# Patient Record
Sex: Female | Born: 1935 | Hispanic: No | Smoking: Never smoker
Health system: Southern US, Community
[De-identification: ages and names within clinical notes are randomized; demographics above are authoritative.]

## PROBLEM LIST (undated history)

## (undated) DIAGNOSIS — Z8542 Personal history of malignant neoplasm of other parts of uterus: Secondary | ICD-10-CM

## (undated) DIAGNOSIS — J309 Allergic rhinitis, unspecified: Secondary | ICD-10-CM

## (undated) DIAGNOSIS — K219 Gastro-esophageal reflux disease without esophagitis: Secondary | ICD-10-CM

## (undated) DIAGNOSIS — E119 Type 2 diabetes mellitus without complications: Secondary | ICD-10-CM

## (undated) DIAGNOSIS — J45909 Unspecified asthma, uncomplicated: Secondary | ICD-10-CM

## (undated) DIAGNOSIS — Z853 Personal history of malignant neoplasm of breast: Secondary | ICD-10-CM

## (undated) HISTORY — DX: Personal history of malignant neoplasm of breast: Z85.3

## (undated) HISTORY — DX: Personal history of malignant neoplasm of other parts of uterus: Z85.42

## (undated) HISTORY — DX: Type 2 diabetes mellitus without complications: E11.9

## (undated) HISTORY — PX: MASTECTOMY: SHX3

## (undated) HISTORY — PX: TOTAL ABDOMINAL HYSTERECTOMY W/ BILATERAL SALPINGOOPHORECTOMY: SHX83

## (undated) HISTORY — DX: Unspecified asthma, uncomplicated: J45.909

## (undated) HISTORY — DX: Allergic rhinitis, unspecified: J30.9

## (undated) HISTORY — DX: Gastro-esophageal reflux disease without esophagitis: K21.9

---

## 2004-06-14 ENCOUNTER — Ambulatory Visit: Payer: Self-pay | Admitting: Internal Medicine

## 2004-12-20 ENCOUNTER — Ambulatory Visit: Payer: Self-pay | Admitting: Internal Medicine

## 2005-06-14 ENCOUNTER — Ambulatory Visit: Payer: Self-pay | Admitting: Internal Medicine

## 2005-12-18 ENCOUNTER — Ambulatory Visit: Payer: Self-pay | Admitting: Internal Medicine

## 2006-06-18 ENCOUNTER — Ambulatory Visit: Payer: Self-pay | Admitting: Internal Medicine

## 2006-12-16 ENCOUNTER — Ambulatory Visit: Payer: Self-pay | Admitting: Internal Medicine

## 2007-02-04 ENCOUNTER — Ambulatory Visit: Payer: Self-pay | Admitting: Internal Medicine

## 2007-08-05 ENCOUNTER — Ambulatory Visit: Payer: Self-pay | Admitting: Internal Medicine

## 2007-08-05 DIAGNOSIS — J302 Other seasonal allergic rhinitis: Secondary | ICD-10-CM

## 2007-08-05 DIAGNOSIS — J4520 Mild intermittent asthma, uncomplicated: Secondary | ICD-10-CM | POA: Insufficient documentation

## 2007-08-05 DIAGNOSIS — J3089 Other allergic rhinitis: Secondary | ICD-10-CM

## 2007-08-05 LAB — CONVERTED CEMR LAB
ALT: 14 units/L (ref 0–35)
Alkaline Phosphatase: 40 units/L (ref 39–117)
Bilirubin, Direct: 0.1 mg/dL (ref 0.0–0.3)
CO2: 30 meq/L (ref 19–32)
Chloride: 108 meq/L (ref 96–112)
Glucose, Bld: 93 mg/dL (ref 70–99)
Hemoglobin: 12.9 g/dL (ref 12.0–15.0)
Lymphocytes Relative: 25.7 % (ref 12.0–46.0)
Monocytes Relative: 7.7 % (ref 3.0–12.0)
Neutrophils Relative %: 62.7 % (ref 43.0–77.0)
Platelets: 296 10*3/uL (ref 150–400)
Potassium: 4.5 meq/L (ref 3.5–5.1)
RDW: 13.6 % (ref 11.5–14.6)
Sodium: 142 meq/L (ref 135–145)
Total Bilirubin: 0.5 mg/dL (ref 0.3–1.2)
Total Protein: 7.1 g/dL (ref 6.0–8.3)

## 2007-08-17 DIAGNOSIS — K219 Gastro-esophageal reflux disease without esophagitis: Secondary | ICD-10-CM

## 2007-08-17 DIAGNOSIS — E119 Type 2 diabetes mellitus without complications: Secondary | ICD-10-CM | POA: Insufficient documentation

## 2007-08-18 ENCOUNTER — Telehealth (INDEPENDENT_AMBULATORY_CARE_PROVIDER_SITE_OTHER): Payer: Self-pay | Admitting: *Deleted

## 2008-02-17 ENCOUNTER — Ambulatory Visit: Payer: Self-pay | Admitting: Pulmonary Disease

## 2008-03-16 ENCOUNTER — Telehealth (INDEPENDENT_AMBULATORY_CARE_PROVIDER_SITE_OTHER): Payer: Self-pay | Admitting: *Deleted

## 2008-08-16 ENCOUNTER — Ambulatory Visit: Payer: Self-pay | Admitting: Internal Medicine

## 2009-03-16 ENCOUNTER — Ambulatory Visit: Payer: Self-pay | Admitting: Internal Medicine

## 2009-09-13 ENCOUNTER — Ambulatory Visit: Payer: Self-pay | Admitting: Internal Medicine

## 2010-03-20 ENCOUNTER — Ambulatory Visit
Admission: RE | Admit: 2010-03-20 | Discharge: 2010-03-20 | Payer: Self-pay | Source: Home / Self Care | Attending: Internal Medicine | Admitting: Internal Medicine

## 2010-04-13 NOTE — Assessment & Plan Note (Signed)
Summary: rov 6 months///kp   Primary Provider/Referring Provider:  Livia Snellen  CC:  6 month follow up. pt states breathing has unchanged and denies any cough. pt states overall things are going well. Marland Kitchen  History of Present Illness: March 16, 2009- Asthma, allergic rhinitis She caught cold at [employer]. Says having doxy available kept her out of ER. Still cough productive yellow. Denies fever, chest pain. Some wheeze. Only needed nebulizer once. Continues Advair. Needs a rescue inhaler.  September 13, 2009- Asthma, allergic rhinitis Very hot days "drain her". Arthritis pains are more limiting. Has recurrent rash on left flank she treats with neosporin- resolved. Has not needed benzonatate. Uses 1 puff of her rescue inhaler about 1-2x/ week if she feels dyspneic out doing errands. Again she mostly blames the heat. Denies chest pain, palpitation or wheezing. She stays in when grass being mowed.   March 20, 2010- Asthma, allergic rhinitis Nurse-CC: 6 month follow up. pt states breathing has unchanged and denies any cough. pt states overall things are going well.  Since i last saw her in July she remembers one cold when she did wheeze some, but cleared up.  Was having some nsasl stuffiness last week while working outdoors.    Asthma History    Asthma Control Assessment:    Age range: 12+ years    Symptoms: 0-2 days/week    Nighttime Awakenings: 0-2/month    Interferes w/ normal activity: no limitations    SABA use (not for EIB): 0-2 days/week    Asthma Control Assessment: Well Controlled   Preventive Screening-Counseling & Management  Alcohol-Tobacco     Smoking Status: never  Allergies: No Known Drug Allergies  Past History:  Past Medical History: Last updated: 03/16/2009 ALLERGIC RHINITIS (ICD-477.9) ASTHMA (ICD-493.90) Diabetes, Type 2 G E R Hx breast ca- right mastectomy Hx uterine cancer  Past Surgical History: Last updated: 03/16/2009 Right mastectomy [facility]  and B S O  Family History: Last updated: 08/05/2007  Allergies- daughter, grandchildren Asthma-grandson, daughter Heart disease-mother rheumatism-great grandmother cancer-self brother- diabetes sister-diabetes, high cholesterol 6 additional sisters living  Social History: Last updated: 08/05/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   Risk Factors: Smoking Status: never (03/20/2010)  Review of Systems      See HPI  The patient denies shortness of breath with activity, shortness of breath at rest, productive cough, non-productive cough, coughing up blood, chest pain, irregular heartbeats, acid heartburn, indigestion, loss of appetite, weight change, abdominal pain, difficulty swallowing, sore throat, tooth/dental problems, headaches, nasal congestion/difficulty breathing through nose, and sneezing.    Vital Signs:  Patient profile:   77 year old female Height:      60 inches Weight:      203.38 pounds O2 Sat:      96 % on Room air Pulse rate:   90 / minute BP sitting:   150 / 68  (right arm) Cuff size:   large  Vitals Entered By: Carver Fila (March 20, 2010 4:22 PM)  O2 Flow:  Room air CC: 6 month follow up. pt states breathing has unchanged and denies any cough. pt states overall things are going well.  Comments meds and allergies updated Phone number updated Carver Fila  March 20, 2010 4:22 PM    Physical Exam  Additional Exam:  General: A/Ox3; pleasant and cooperative, NAD, overweight SKIN: no rash, lesions NODES: no lymphadenopathy HEENT: [place]/AT, EOM- WNL, Conjuctivae- clear,  PERRLA, TM-WNL, Nose- sniffing watery congested, Throat- clear and wnl, Mallampati  IV NECK: Supple w/ fair ROM, JVD- trace, normal carotid impulses w/o bruits Thyroid-  CHEST: Faint expiratory wheeze right upper back, fair airflow, unlabored HEART: RRR, no m/g/r heard ABDOMEN: Soft and nl;  EAV:WUJW, nl pulses, no edema  NEURO:  Grossly intact to observation      Impression & Recommendations:  Problem # 1:  ASTHMA (ICD-493.90) Well controlled with appropriate use of her medicines. She asks to continue 6 month follow-up.   Problem # 2:  ALLERGIC RHINITIS (ICD-477.9) Variable stuffiness is overall, not bad enough to require intervention.  Medications Added to Medication List This Visit: 1)  Icaps Mv Tabs (Multiple vitamins-minerals) .... Once daily 2)  Refresh Eye Drops  .Marland Kitchen.. 1 drop each eye two times a day  Other Orders: Est. Patient Level III (11914)  Patient Instructions: 1)  Please schedule a follow-up appointment in 6 months. 2)  Continue present meds. Please call sooner as needed.    Immunization History:  Influenza Immunization History:    Influenza:  historical (11/10/2009)

## 2010-04-13 NOTE — Assessment & Plan Note (Signed)
Summary: 6 MONTHS/APC   Primary Provider/Referring Provider:  Livia Snellen  CC:  6 month follow up visit-asthma and allergies. .  History of Present Illness: 02/17/08-Asthma, allergic rhinitis ER for a flare of asthma, blames overheating in mid summer but ok since then. Uses diuretic as needed to control edema. Tolerated colonoscopy without respiratory problems. had flu vax, declines H1n1- low risk.  08/16/08- Asthma, allergic rhinitis.........................daughter here with [place]. Flutter sensation under left breast intermittently. She blames stress of sister with Alzheimers. Denies exertional chest pain, radiation, heartburn, nausea, vominting, discolored sputum.. Feels tired. Bothered by hot weather. Alprazolam  helps this taken two times a day.  March 16, 2009- Asthma, allergic rhinitis She caught cold at [facility]. Says having doxy available kept her out of ER. Still cough productive yellow. Denies fever, chest pain. Some wheeze. Only needed nebulizer once. Continues Advair. Needs a rescue inhaler.  September 13, 2009- Asthma, allergic rhinitis Very hot days "drain her". Arthritis pains are more limiting. Has recurrent rash on left flank she treats with neosporin- resolved. Has not needed benzonatate. Uses 1 puff of her rescue inhaler about 1-2x/ week if she feels dyspneic out doing errands. Again she mostly blames the heat. Denies chest pain, palpitation or wheezing. She stays in when grass being mowed.    Asthma History    Initial Asthma Severity Rating:    Age range: 12+ years    Symptoms: 0-2 days/week    Nighttime Awakenings: 0-2/month    Interferes w/ normal activity: no limitations    SABA use (not for EIB): 0-2 days/week    Asthma Severity Assessment: Intermittent   Preventive Screening-Counseling & Management  Alcohol-Tobacco     Smoking Status: never  Current Medications (verified): 1)  Advair Diskus 250-50 Mcg/dose  Misc (Fluticasone-Salmeterol) .Marland Kitchen.. 1 Puff Two  Times A Day Rinse Well 2)  Benzonatate 100 Mg  Caps (Benzonatate) .Marland Kitchen.. 1 Four Times A Day As Needed For Cough 3)  Accolate 20 Mg  Tabs (Zafirlukast) .... Take 1 Two Times A Day 4)  Exforge 10-320 Mg  Tabs (Amlodipine Besylate-Valsartan) .... Take 1 By Mouth Once Daily 5)  Metformin Hcl 500 Mg  Tabs (Metformin Hcl) .... Take 1/2 By Mouth  Once Daily 6)  Lipitor 10 Mg  Tabs (Atorvastatin Calcium) .... Take 1 By Mouth Once Daily 7)  Furosemide 20 Mg  Tabs (Furosemide) .... Take 1 By Mouth Once Daily As Needed 8)  Tamoxifen Citrate 20 Mg  Tabs (Tamoxifen Citrate) .... Take 1 By Mouth Once Daily 9)  Alprazolam 0.25 Mg  Tabs (Alprazolam) .... Take 1 By Mouth Two Times A Day 10)  Boniva 150 Mg  Tabs (Ibandronate Sodium) .... Take 1 By Mouth Every Month 11)  Klor-Con M20 20 Meq  Tbcr (Potassium Chloride Crys Cr) .... Take 1 By Mouth Two Times A Day 12)  Restasis 0.05 %  Emul (Cyclosporine) .Marland Kitchen.. 1 Drop in Eye Two Times A Day 13)  Centrum Silver   Tabs (Multiple Vitamins-Minerals) .... Take 1 By Mouth Once Daily 14)  Albuterol Sulfate (2.5 Mg/24ml) 0.083% Nebu (Albuterol Sulfate) .Marland Kitchen.. 1 Four Times A Day As Needed 15)  Proair Hfa 108 (90 Base) Mcg/act Aers (Albuterol Sulfate) .... 2 Puffs Four Times A Day As Needed Rescue 16)  Doxycycline Hyclate 100 Mg Tabs (Doxycycline Hyclate) .... 2 Today Then One Daily 17)  Benzonatate 100 Mg Caps (Benzonatate) .Marland Kitchen.. 1 Four Times A Day As Needed Cough  Allergies (verified): No Known Drug Allergies  Past  History:  Past Medical History: Last updated: 03/16/2009 ALLERGIC RHINITIS (ICD-477.9) ASTHMA (ICD-493.90) Diabetes, Type 2 G E R Hx breast ca- right mastectomy Hx uterine cancer  Past Surgical History: Last updated: 03/16/2009 Right mastectomy T A [facility] and B S O  Family History: Last updated: 08/05/2007  Allergies- daughter, grandchildren Asthma-grandson, daughter Heart disease-mother rheumatism-great grandmother cancer-self brother-  diabetes sister-diabetes, high cholesterol 6 additional sisters living  Social History: Last updated: 08/05/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   Risk Factors: Smoking Status: never (09/13/2009)  Review of Systems      See HPI  Vital Signs:  Patient profile:   77 year old female Height:      60 inches Weight:      201 pounds BMI:     39.40 O2 Sat:      95 % on Room air Pulse rate:   85 / minute BP sitting:   132 / 82  (left arm) Cuff size:   regular  Vitals Entered By: Reynaldo Minium CMA (September 13, 2009 9:47 AM)  O2 Flow:  Room air CC: 6 month follow up visit-asthma and allergies.    Physical Exam  Additional Exam:  General: A/Ox3; pleasant and cooperative, NAD, overweight SKIN: no rash, lesions NODES: no lymphadenopathy HEENT: /AT, EOM- WNL, Conjuctivae- clear, PERRLA, TM-WNL, Nose- sniffing watery congested, Throat- clear and wnl NECK: Supple w/ fair ROM, JVD- none, normal carotid impulses w/o bruits Thyroid-  CHEST: Faint expiratory wheeze right upper back, fair airflow, unlabored HEART: RRR, no m/g/r heard ABDOMEN: Soft and nl;  UEA:VWUJ, nl pulses, no edema  NEURO: Grossly intact to observation      Impression & Recommendations:  Problem # 1:  ASTHMA (ICD-493.90) Good control. She doesn't distinguish dyspnea related to heat, humiditiy and air quality, from asthma with wheeze. She is not needing rescue inhaler much, and carries dust mask. I think she is doing pretty well.  Problem # 2:  ALLERGIC RHINITIS (ICD-477.9)  Seasonal stuffiness is now minimal.   Problem # 3:  G E R D (ICD-530.81) Feels controlled,without sleep disturbance by heart burn or cough.  Other Orders: Est. Patient Level III (81191)  Patient Instructions: 1)  Please schedule a follow-up appointment in 6 months. 2)  Call sooner as needed.

## 2010-04-13 NOTE — Assessment & Plan Note (Signed)
Summary: 6 months/apc   Primary Provider/Referring Provider:  Livia Snellen  CC:  6 mo follow up.  states breathing is doing well overall.  having nasal congestion, cough with yellow mucus, and and chest fullness x1wk.  c/o coughing up a small dark red clot yeterday.  had rx for doxy on hold-will finish it tomorrow.  requesting rx for benzonatate.Marland Kitchen  History of Present Illness: .08/05/07- [name] [name] returns for follow-up of her asthma and allergic rhinitis.  She's had increased cough over the past 3 months.  Her primary physician.  Given antibiotic a prednisone taper and cough medicine, which she finished last week.  She feels much better, but somewhat tired.  She is coughing less.  There's been nothing purulent or bloody and no chest pain or palpitation. her daughter was with her today and participated in the discussion.  02/17/08-Asthma, allergic rhinitis ER for a flare of asthma, blames overheating in mid summer but ok since then. Uses diuretic as needed to control edema. Tolerated colonoscopy without respiratory problems. had flu vax, declines H1n1- low risk.  08/16/08- Asthma, allergic rhinitis.........................daughter here with [place]. Flutter sensation under left breast intermittently. She blames stress of sister with Alzheimers. Denies exertional chest pain, radiation, heartburn, nausea, vominting, discolored sputum.. Feels tired. Bothered by hot weather. Alprazolam  helps this taken two times a day.  March 16, 2009- Asthma, allergic rhinitis She caught cold at [facility]. Says having doxy available kept her out of ER. Still cough productive yellow. Denies fever, chest pain. Some wheeze. Only needed nebulizer once. Continues Advair. Needs a rescue inhaler.   Current Medications (verified): 1)  Advair Diskus 250-50 Mcg/dose  Misc (Fluticasone-Salmeterol) .Marland Kitchen.. 1 Puff Two Times A Day Rinse Well 2)  Benzonatate 100 Mg  Caps (Benzonatate) .Marland Kitchen.. 1 Four Times A Day As Needed For Cough 3)   Accolate 20 Mg  Tabs (Zafirlukast) .... Take 1 Two Times A Day 4)  Exforge 10-320 Mg  Tabs (Amlodipine Besylate-Valsartan) .... Take 1 By Mouth Once Daily 5)  Metformin Hcl 500 Mg  Tabs (Metformin Hcl) .... Take 1/2 By Mouth  Once Daily 6)  Lipitor 10 Mg  Tabs (Atorvastatin Calcium) .... Take 1 By Mouth Once Daily 7)  Furosemide 20 Mg  Tabs (Furosemide) .... Take 1 By Mouth Once Daily As Needed 8)  Tamoxifen Citrate 20 Mg  Tabs (Tamoxifen Citrate) .... Take 1 By Mouth Once Daily 9)  Alprazolam 0.25 Mg  Tabs (Alprazolam) .... Take 1 By Mouth Two Times A Day 10)  Boniva 150 Mg  Tabs (Ibandronate Sodium) .... Take 1 By Mouth Every Month 11)  Klor-Con M20 20 Meq  Tbcr (Potassium Chloride Crys Cr) .... Take 1 By Mouth Two Times A Day 12)  Restasis 0.05 %  Emul (Cyclosporine) .Marland Kitchen.. 1 Drop in Eye Two Times A Day 13)  Centrum Silver   Tabs (Multiple Vitamins-Minerals) .... Take 1 By Mouth Once Daily 14)  Albuterol Sulfate (2.5 Mg/23ml) 0.083% Nebu (Albuterol Sulfate) .Marland Kitchen.. 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   Risk Factors: Smoking Status: never (08/05/2007)  Past Medical History: ALLERGIC RHINITIS (ICD-477.9) ASTHMA (ICD-493.90) Diabetes, Type 2 G E R Hx breast ca- right  mastectomy Hx uterine cancer  Past Surgical History: Right mastectomy T A H and B S O  Review of Systems      See HPI       The patient complains of dyspnea on exertion and prolonged cough.  The patient denies anorexia, fever, weight loss, weight gain, vision loss, decreased hearing,  hoarseness, chest pain, syncope, peripheral edema, headaches, hemoptysis, abdominal pain, and severe indigestion/heartburn.    Vital Signs:  Patient profile:   77 year old female Height:      60 inches Weight:      205.50 pounds BMI:     40.28 O2 Sat:      94 % on Room air Pulse rate:   85 / minute BP sitting:   130 / 72  (left arm) Cuff size:   regular  Vitals Entered By: Gweneth Dimitri RN (March 16, 2009 9:59 AM)  O2 Flow:  Room air CC: 6 mo follow up.  states breathing is doing well overall.  having nasal congestion, cough with yellow mucus, and chest fullness x1wk.  c/o coughing up a small dark red clot yeterday.  had rx for doxy on hold-will finish it tomorrow.  requesting rx for benzonatate. Comments Medications reviewed with patient Gweneth Dimitri RN  March 16, 2009 9:59 AM    Physical Exam  Additional Exam:  General: A/Ox3; pleasant and cooperative, NAD, overweight SKIN: no rash, lesions NODES: no lymphadenopathy HEENT: [place]/AT, EOM- WNL, Conjuctivae- clear, PERRLA, TM-WNL, Nose- sniffing watery congested, Throat- clear and wnl NECK: Supple w/ fair ROM, JVD- none, normal carotid impulses w/o bruits Thyroid-  CHEST: Clear to P&A, fair airflow, unlabored HEART: RRR, no m/g/r heard ABDOMEN: Soft and nl;  YQI:HKVQ, nl pulses, no edema  NEURO: Grossly intact to observation      Impression & Recommendations:  Problem # 1:  ASTHMA (ICD-493.90) Generally good control, but needs a rescue inhaler. Now an incidental viral syndrome. I will let her keep doxy on hand.  Medications Added to Medication List This Visit: 1)  Furosemide 20 Mg Tabs (Furosemide) .... Take 1 by mouth once daily as needed 2)  Proair Hfa 108 (90 Base) Mcg/act Aers (Albuterol sulfate) .... 2 puffs four times a day as needed rescue 3)  Doxycycline Hyclate 100 Mg Tabs (Doxycycline hyclate) .... 2 today then one daily 4)  Benzonatate 100 Mg Caps (Benzonatate) .Marland Kitchen.. 1 four times a day as needed  cough  Other Orders: Est. Patient Level III (25956)  Patient Instructions: 1)  Please schedule a follow-up appointment in 6 months. 2)  Scripts to refill doxycycline and benzonatate 3)  Script sample and instruction Proair rescue inhaler, 2 puffs up to 4 times daily as needed when you aren't near your nebulizer machine  Prescriptions: BENZONATATE 100 MG  CAPS (BENZONATATE) 1 four times a day as needed for cough  #20 x prn   Entered and Authorized by:   Waymon Budge MD   Signed by:   Waymon Budge MD on 03/16/2009   Method used:   Print then Give to Patient   RxID:   [id number] BENZONATATE 100 MG CAPS (BENZONATATE) 1 four times a day as needed cough  #30 x prn   Entered and Authorized by:   Waymon Budge MD   Signed by:   Waymon Budge MD on 03/16/2009   Method used:   Print then Give to Patient   RxID:   [id number] DOXYCYCLINE HYCLATE 100 MG TABS (DOXYCYCLINE HYCLATE)  2 today then one daily  #8 x 3   Entered and Authorized by:   Waymon Budge MD   Signed by:   Waymon Budge MD on 03/16/2009   Method used:   Print then Give to Patient   RxID:   [id number] PROAIR HFA 108 (90 BASE) MCG/ACT AERS (ALBUTEROL SULFATE) 2 puffs four times a day as needed rescue  #1 x prn   Entered and Authorized by:   Waymon Budge MD   Signed by:   Waymon Budge MD on 03/16/2009   Method used:   Print then Give to Patient   RxID:   [id number]     Immunization History:  Influenza Immunization History:    Influenza:  historical (11/29/2008)  Pneumovax Immunization History:    Pneumovax:  historical (12/11/2006)

## 2010-07-25 NOTE — Assessment & Plan Note (Signed)
 HEALTHCARE                             PULMONARY OFFICE NOTE   NAME:[name], [name]                         MRN:          [id number]  DATE:02/04/2007                            DOB:          04-24-1925    PROBLEMS:  1. Allergic rhinitis.  2. Asthma.  3. Esophageal reflux.  4. Diabetes.  5. Breast cancer/right mastectomy.   HISTORY:  Six month follow up. Arthritis bothers her and limits her  walking, so she does not notice exertional dyspnea much, but she has not  had any acute attacks recently and feels stable. She is comfortable with  present therapy. She has had flu vaccine and has had at least 2 doses of  pneumococcal vaccine.   MEDICATIONS:  Her medication list is reviewed. She continues:  1. Albuterol by nebulizer q.i.d. p.r.n.  2. Advair 250/50 b.i.d.  3. Accolate 20 mg b.i.d.   She has not needed a rescue inhaler, although we discussed it again.   OBJECTIVE:  Weight 196 pounds, blood pressure 126/80, pulse 75, room air  saturation 97%. She seems very clear with no rales, rhonchi, or  dullness. There is no neck vein distension or strider. No cyanosis,  clubbing, or peripheral edema. Heart sounds are regular without murmur.  Nasal airway is not congested.   IMPRESSION:  Stable, well controlled asthma and rhinitis.   PLAN:  No changes, although watch need for a rescue inhaler. Fair to  offer to see in her in a year, and she asked to be seen again in six  months, so we will do that.     Clinton D. Maple Hudson, MD, Tonny Bollman, FACP  Electronically Signed    CDY/MedQ  DD: 02/08/2007  DT: 02/09/2007  Job #: [id number]   cc:   Livia Snellen

## 2010-07-28 NOTE — Assessment & Plan Note (Signed)
 HEALTHCARE                               PULMONARY OFFICE NOTE   NAME:[name] [name], [name] [name]                         MRN:          [id number]  DATE:12/18/2005                            DOB:          07/18/1925    PROBLEMS:  1. Allergic rhinitis.  2. Asthma.  3. Esophageal reflux.  4. Diabetes.  5. Breast cancer/right mastectomy.   HISTORY:  Since last year, unfortunately, breast cancer was diagnosed and  she underwent a right mastectomy without adjuvant therapy.  She had no  respiratory problems during anesthesia.  She and her family member comment  that her asthma control has been very much better in recent years without  the frequent emergency room trips characteristic previously.  She feels well  today.   MEDICATION:  1. Metformin 1/2 b.i.d.  2. Boniva once monthly.  3. Advair 250/50.  4. Accolate 20 mg b.i.d.  5. Diovan HCT 80 mg.  6. Lipitor 10 mg.  7. Restasis.  8. Vitamins.  9. Calcium.  10.Acyclovir.  11.Tamoxifen 20 mg.  12.Albuterol by nebulizer q.i.d. p.r.n.   No medication allergy.   She never needs to use her nebulizer and has not needed a rescue inhaler.   OBJECTIVE:  VITAL SIGNS:  Weight 196 pounds.  BPP 122/84.  Pulse regular 69.  Room air saturation 98%.  HEENT:  Nose and throat are clear.  LUNGS:  Lung fields are clear with no wheeze, rales, or rhonchi and  breathing is unlabored.  HEART:  Heart sounds are regular without murmur, rub, or gallop.  EXTREMITIES:  There is no edema.   IMPRESSION:  Stable well-controlled asthma and seasonal rhinitis.   PLAN:  No need to change medications.  She requests and is given a Z-Pak to  hold with discussion.  Schedule return in 6 months, early p.r.n.       Clinton D. Maple Hudson, MD, FCCP, FACP      CDY/MedQ  DD:  12/23/2005  DT:  12/24/2005  Job #:  [id number]

## 2010-07-28 NOTE — Assessment & Plan Note (Signed)
[facility]                             PULMONARY OFFICE NOTE   NAME:[name], [name]                         MRN:          [id number]  DATE:06/18/2006                            DOB:          09/06/1925    PROBLEM:  1. Allergic rhinitis.  2. Asthma.  3. Esophageal reflux.  4. Diabetes.  5. Breast cancer/right mastectomy.   HISTORY:  She had a cold and used the standby Z-Pak.  She thinks that  this helped her get better much faster.  Recently, she has had mild head  and chest congestion blamed on pollen.   MEDICATIONS:  Her list is little changed on review.  She is using:  1. Advair 250/50.  2. Accolate 20 mg b.i.d.  3. She has an albuterol nebulizer for q.i.d. p.r.n. use.   ALLERGIES:  No medication allergy.   OBJECTIVE:  Weight 191 pounds.  Blood pressure 114/58, pulse regular at  62, room air saturation 96%.  She is alert and cheerful.  Eyes, nose and throat look clear.  There is no neck vein distention or stridor.  LUNGS:  Clear without wheeze or cough.  HEART:  Sounds regular without murmur.  No edema.   IMPRESSION:  1. Stable asthma.  2. Perhaps minimal seasonal rhinitis.   PLAN:  No changes.  We refilled her Advair 250/50 and gave her a  prescription for a Z-Pak to hold at her request with discussion.  Scheduled return in 6 months, earlier p.r.n.     Clinton D. Maple Hudson, MD, Tonny Bollman, FACP  Electronically Signed    CDY/MedQ  DD: 06/18/2006  DT: 06/19/2006  Job #: [id number]

## 2010-09-18 ENCOUNTER — Ambulatory Visit (INDEPENDENT_AMBULATORY_CARE_PROVIDER_SITE_OTHER): Payer: Medicare Other | Admitting: Internal Medicine

## 2010-09-18 ENCOUNTER — Encounter: Payer: Self-pay | Admitting: Internal Medicine

## 2010-09-18 VITALS — BP 126/78 | HR 72 | Ht 60.0 in | Wt 194.6 lb

## 2010-09-18 DIAGNOSIS — J309 Allergic rhinitis, unspecified: Secondary | ICD-10-CM

## 2010-09-18 DIAGNOSIS — K219 Gastro-esophageal reflux disease without esophagitis: Secondary | ICD-10-CM

## 2010-09-18 DIAGNOSIS — J45909 Unspecified asthma, uncomplicated: Secondary | ICD-10-CM

## 2010-09-18 NOTE — Assessment & Plan Note (Signed)
Reminded of reflux precautions and potential for reflux to aggravate asthma if ignored. She seems to be doing well.

## 2010-09-18 NOTE — Assessment & Plan Note (Signed)
Good control on current meds. We discussed options with no changes appropriate.

## 2010-09-18 NOTE — Patient Instructions (Addendum)
Continue present meds  Please call as needed 

## 2010-09-18 NOTE — Progress Notes (Signed)
  Subjective:    Patient ID: [name] [name], female    DOB: 11-11-1925, 77 y.o.   MRN: [id number]  HPI 09/18/10- 17 yoF never smoker followed for asthma, allergic rhinitis, complicated by DM, GERD, hx right breast Ca Last here January 9. 2012- Note reviewed.  Since last here she feels asthma has been well controlled, with no major flares. Occasional use of rescue inhaler, but no need for nebulizer. Continues daily Advair.  Denies heart problems. Continues tamoxifen, now nearly 5 years out from breast cancer right- lumpectomy no XRT.  Marland KitchenReview of Systems .Constitutional:   No weight loss, night sweats,  Fevers, chills, fatigue, lassitude. HEENT:   No headaches,  Difficulty swallowing,  Tooth/dental problems,  Sore throat,                No sneezing, itching, ear ache, nasal congestion, post nasal drip,   CV:  No chest pain,  Orthopnea, PND, swelling in lower extremities, anasarca, dizziness, palpitations  GI  No heartburn, indigestion, abdominal pain, nausea, vomiting, diarrhea, change in bowel habits, loss of appetite  Resp: No shortness of breath with exertion or at rest.  No excess mucus, no productive cough,  No non-productive cough,  No coughing up of blood.  No change in color of mucus.  No wheezing.    Skin: no rash or lesions.  GU: no dysuria, change in color of urine, no urgency or frequency.  No flank pain.  MS:  No joint pain or swelling.  No decreased range of motion.  No back pain.  Psych:  No change in mood or affect. No depression or anxiety.  No memory loss.      Objective:   Physical Exam General- Alert, Oriented, Affect-appropriate, Distress- none acute   Calm and conversational  Skin- rash-none, lesions- none, excoriation- none  Lymphadenopathy- none  Head- atraumatic  Eyes- Gross vision intact, PERRLA, conjunctivae clear secretions  Ears- Hearing, canals, Tm-normal  Nose- Clear, no- Septal dev, mucus, polyps, erosion, perforation   Throat- Mallampati III-IV  , mucosa clear , drainage- none, tonsils- atrophic  Neck- flexible , trachea midline, no stridor , thyroid nl, carotid no bruit  Chest - symmetrical excursion , unlabored     Heart/CV- RRR , no murmur , no gallop  , no rub, nl s1 s2                     - JVD- none , edema- none, stasis changes- none, varices- none     Lung- clear to P&A, wheeze- none, cough- none , dullness-none, rub- none     Chest wall-   Abd- tender-no, distended-no, bowel sounds-present, HSM- no  Br/ Gen/ Rectal- Not done, not indicated  Extrem- cyanosis- none, clubbing, none, atrophy- none, strength- nl  Neuro- grossly intact to observation         Assessment & Plan:

## 2010-09-18 NOTE — Assessment & Plan Note (Signed)
Good control

## 2010-11-02 ENCOUNTER — Other Ambulatory Visit: Payer: Self-pay | Admitting: Internal Medicine

## 2011-01-22 ENCOUNTER — Telehealth: Payer: Self-pay | Admitting: Internal Medicine

## 2011-01-22 NOTE — Telephone Encounter (Signed)
LMTCB x 1 

## 2011-01-23 MED ORDER — AZITHROMYCIN 250 MG PO TABS: ORAL_TABLET | ORAL | Status: DC

## 2011-01-23 NOTE — Telephone Encounter (Signed)
atc line busy x 3 wcb 

## 2011-01-23 NOTE — Telephone Encounter (Signed)
Spoke with pt's daughter. She states that pt forgot to ask for rx for abx to hold on to at her last ov, and wants to know if can have something now. She states that she is not currently sick, but Dr Maple Hudson usually will call in abx for her to hold in case she gets sick in the winter. Please advise, thanks! No Known Allergies

## 2011-01-23 NOTE — Telephone Encounter (Signed)
OK to offer Z pak, refill x 1. She can hold this until needed.

## 2011-01-23 NOTE — Telephone Encounter (Signed)
Rx for zpack was sent and spoke with pt's daughter and notified that this was done.

## 2011-01-23 NOTE — Telephone Encounter (Signed)
LMTCB

## 2011-03-14 ENCOUNTER — Encounter: Payer: Self-pay | Admitting: Internal Medicine

## 2011-03-14 ENCOUNTER — Ambulatory Visit (INDEPENDENT_AMBULATORY_CARE_PROVIDER_SITE_OTHER): Payer: Medicare Other | Admitting: Internal Medicine

## 2011-03-14 ENCOUNTER — Ambulatory Visit (INDEPENDENT_AMBULATORY_CARE_PROVIDER_SITE_OTHER)
Admission: RE | Admit: 2011-03-14 | Discharge: 2011-03-14 | Disposition: A | Discharge status: Home / Self Care | Payer: Medicare Other | Source: Ambulatory Visit | Attending: Internal Medicine | Admitting: Internal Medicine

## 2011-03-14 DIAGNOSIS — J45909 Unspecified asthma, uncomplicated: Secondary | ICD-10-CM

## 2011-03-14 DIAGNOSIS — R0989 Other specified symptoms and signs involving the circulatory and respiratory systems: Secondary | ICD-10-CM

## 2011-03-14 DIAGNOSIS — R0600 Dyspnea, unspecified: Secondary | ICD-10-CM

## 2011-03-14 DIAGNOSIS — J309 Allergic rhinitis, unspecified: Secondary | ICD-10-CM

## 2011-03-14 DIAGNOSIS — R0609 Other forms of dyspnea: Secondary | ICD-10-CM

## 2011-03-14 MED ORDER — AZITHROMYCIN 250 MG PO TABS: ORAL_TABLET | ORAL | Status: AC

## 2011-03-14 MED ORDER — FLUTICASONE-SALMETEROL 250-50 MCG/DOSE IN AEPB: 1.0000 | INHALATION_SPRAY | Freq: Two times a day (BID) | RESPIRATORY_TRACT | Status: DC

## 2011-03-14 MED ORDER — ALBUTEROL SULFATE (2.5 MG/3ML) 0.083% IN NEBU: 2.5000 mg | INHALATION_SOLUTION | Freq: Four times a day (QID) | RESPIRATORY_TRACT | Status: DC | PRN

## 2011-03-14 NOTE — Progress Notes (Signed)
Subjective:    Patient ID: [name] [name], female    DOB: 07-Aug-1925, 78 y.o.   MRN: [id number]  HPI 09/18/10- 84 yoF never smoker followed for asthma, allergic rhinitis, complicated by DM, GERD, hx right breast Ca Last here January 9. 2012- Note reviewed.  Since last here she feels asthma has been well controlled, with no major flares. Occasional use of rescue inhaler, but no need for nebulizer. Continues daily Advair.  Denies heart problems. Continues tamoxifen, now nearly 5 years out from breast cancer right- lumpectomy no XRT.  03/14/11- 84 yoF never smoker followed for asthma, allergic rhinitis, complicated by DM, GERD, hx right breast Ca Has had flu vaccine. In November she had a mild exacerbation of asthma associated with a viral chest cold. She took a Z-Pak and got better. She would like to be able to  hold a Z-Pak, which we discussed. She needs routine medication refills but otherwise feels that she is at baseline stable and comfortable currently.   .Review of Systems Constitutional:   No-   weight loss, night sweats, fevers, chills, fatigue, lassitude. HEENT:   No-  headaches, difficulty swallowing, tooth/dental problems, sore throat,       No-  sneezing, itching, ear ache, nasal congestion, post nasal drip,  CV:  No-   chest pain, orthopnea, PND, swelling in lower extremities, anasarca,  dizziness, palpitations Resp: No-   shortness of breath with exertion or at rest.              No-   productive cough,  No non-productive cough,  No- coughing up of blood.              No-   change in color of mucus.  No- wheezing.   Skin: No-   rash or lesions. GI:  No-   heartburn, indigestion, abdominal pain, nausea, vomiting, diarrhea,                 change in bowel habits, loss of appetite GU: MS:  No-   joint pain or swelling.  No- decreased range of motion.  No- back pain. Neuro-     nothing unusual Psych:  No- change in mood or affect. No depression or anxiety.  No memory loss.        Objective:  General- Alert, Oriented, Affect-appropriate, Distress- none acute, overweight Skin- rash-none, lesions- none, excoriation- none Lymphadenopathy- none Head- atraumatic            Eyes- Gross vision intact, PERRLA, conjunctivae clear secretions            Ears- Hearing, canals-normal            Nose- Clear, no-Septal dev, mucus, polyps, erosion, perforation             Throat- Mallampati IV , mucosa clear , drainage- none, tonsils- atrophic Neck- flexible , trachea midline, no stridor , thyroid nl, carotid no bruit Chest - symmetrical excursion , unlabored           Heart/CV- RRR , no murmur , no gallop  , no rub, nl s1 s2                           - JVD- 1-2 cm , edema- none, stasis changes- none, varices- none           Lung- clear to P&A, wheeze- none, cough- none , dullness-none, rub- none. Decreased sounds in bases.  Chest wall-  Abd- tender-no, distended-no, bowel sounds-present, HSM- no Br/ Gen/ Rectal- Not done, not indicated Extrem- cyanosis- none, clubbing, none, atrophy- none, strength- nl Neuro- grossly intact to observation

## 2011-03-14 NOTE — Patient Instructions (Addendum)
Refill scripts were sent to drug store. You will hold on to the azithromycin/ Zpak antibiotic in case needed.  Please call as needed  Order- CXR- dyspnea, asthma

## 2011-03-14 NOTE — Assessment & Plan Note (Signed)
Controlled, with no saeason- associated problems currently.

## 2011-03-14 NOTE — Assessment & Plan Note (Signed)
She had a viral- bronchitis earlier this fall and returned to her baseline. We discussed her medications and will refill. Breath sounds are reduced in the bases and she is a little too heavy for reliable percussion. Her neck veins look a little full but otherwise I don't find signs of fluid overload. Plan-chest x-ray. Continue routine meds.

## 2011-03-20 ENCOUNTER — Other Ambulatory Visit: Payer: Self-pay | Admitting: Internal Medicine

## 2011-03-26 NOTE — Progress Notes (Signed)
Quick Note:  ATC-unable to reach patient or leave a message. Will try again later ______

## 2011-04-05 NOTE — Progress Notes (Signed)
Quick Note:  ATC-unable to reach patient or leave a message. Will try again later; phone continues to say that patient is unavailable.  ______

## 2011-04-17 NOTE — Progress Notes (Signed)
Quick Note:  Pt aware of results. ______ 

## 2011-08-15 ENCOUNTER — Ambulatory Visit (HOSPITAL_COMMUNITY): Admission: RE | Admit: 2011-08-15 | Payer: Medicare Other | Source: Ambulatory Visit | Admitting: Ophthalmology

## 2011-08-15 SURGERY — MINOR CAPSULOTOMY
Anesthesia: LOCAL | Laterality: Left

## 2011-08-17 ENCOUNTER — Encounter (HOSPITAL_COMMUNITY): Payer: Self-pay

## 2011-08-23 ENCOUNTER — Encounter (HOSPITAL_COMMUNITY): Payer: Self-pay | Admitting: Pharmacy Technician

## 2011-09-05 ENCOUNTER — Encounter (HOSPITAL_COMMUNITY)
Admission: RE | Disposition: A | Discharge status: Home / Self Care | Payer: Self-pay | Source: Ambulatory Visit | Attending: Ophthalmology

## 2011-09-05 ENCOUNTER — Ambulatory Visit (HOSPITAL_COMMUNITY)
Admission: RE | Admit: 2011-09-05 | Discharge: 2011-09-05 | Disposition: A | Discharge status: Home / Self Care | Payer: Medicare Other | Source: Ambulatory Visit | Attending: Ophthalmology | Admitting: Ophthalmology

## 2011-09-05 ENCOUNTER — Other Ambulatory Visit: Payer: Self-pay | Admitting: Ophthalmology

## 2011-09-05 ENCOUNTER — Encounter: Payer: Self-pay | Admitting: Ophthalmology

## 2011-09-05 DIAGNOSIS — H269 Unspecified cataract: Secondary | ICD-10-CM | POA: Insufficient documentation

## 2011-09-05 HISTORY — PX: CAPSULOTOMY: SHX5412

## 2011-09-05 SURGERY — MINOR CAPSULOTOMY
Anesthesia: Topical | Laterality: Left

## 2011-09-05 SURGERY — MINOR CAPSULOTOMY
Anesthesia: LOCAL | Site: Eye | Laterality: Left

## 2011-09-05 MED ORDER — CYCLOPENTOLATE-PHENYLEPHRINE 0.2-1 % OP SOLN: 1.0000 [drp] | Freq: Once | OPHTHALMIC | Status: DC

## 2011-09-05 MED ORDER — APRACLONIDINE HCL 0.5 % OP SOLN
OPHTHALMIC | Status: AC
  Filled 2011-09-05: qty 5

## 2011-09-05 MED ORDER — APRACLONIDINE HCL 1 % OP SOLN: 1.0000 [drp] | Freq: Once | OPHTHALMIC | Status: DC

## 2011-09-05 MED ORDER — CYCLOPENTOLATE-PHENYLEPHRINE 0.2-1 % OP SOLN
OPHTHALMIC | Status: AC
  Filled 2011-09-05: qty 2

## 2011-09-05 MED ORDER — CYCLOPENTOLATE-PHENYLEPHRINE 0.2-1 % OP SOLN: 2.0000 [drp] | OPHTHALMIC | Status: DC

## 2011-09-05 MED ORDER — CYCLOPENTOLATE-PHENYLEPHRINE 0.2-1 % OP SOLN
1.0000 [drp] | Freq: Once | OPHTHALMIC | Status: AC
  Administered 2011-09-05: 1 [drp] via OPHTHALMIC

## 2011-09-05 MED ORDER — APRACLONIDINE HCL 1 % OP SOLN
1.0000 [drp] | Freq: Once | OPHTHALMIC | Status: AC
  Administered 2011-09-05: 1 [drp] via OPHTHALMIC
  Filled 2011-09-05: qty 0.1

## 2011-09-05 SURGICAL SUPPLY — 28 items
APPLICATOR COTTON TIP 6IN STRL (MISCELLANEOUS) ×2 IMPLANT
BAG FLD CLT MN 6.25X3.5 (WOUND CARE)
BAG MINI COLL DRAIN (WOUND CARE) IMPLANT
BLADE KERATOME 2.75 (BLADE) ×2 IMPLANT
BLADE MINI RND TIP GREEN BEAV (BLADE) IMPLANT
CLOTH BEACON ORANGE TIMEOUT ST (SAFETY) ×2 IMPLANT
CORDS BIPOLAR (ELECTRODE) ×2 IMPLANT
DRAPE OPHTHALMIC 40X48 W POUCH (DRAPES) ×2 IMPLANT
DRAPE RETRACTOR (MISCELLANEOUS) ×2 IMPLANT
GLOVE ECLIPSE 7.0 STRL STRAW (GLOVE) ×2 IMPLANT
GOWN STRL NON-REIN LRG LVL3 (GOWN DISPOSABLE) ×4 IMPLANT
KIT BASIN OR (CUSTOM PROCEDURE TRAY) ×2 IMPLANT
KIT ROOM TURNOVER OR (KITS) ×2 IMPLANT
KNIFE CRESCENT 2.5 55 ANG (BLADE) ×2 IMPLANT
MARKER SKIN DUAL TIP RULER LAB (MISCELLANEOUS) IMPLANT
NS IRRIG 1000ML POUR BTL (IV SOLUTION) ×2 IMPLANT
PACK CATARACT CUSTOM (CUSTOM PROCEDURE TRAY) ×2 IMPLANT
PACK CATARACT MCHSCP (PACKS) IMPLANT
PAD ARMBOARD 7.5X6 YLW CONV (MISCELLANEOUS) ×4 IMPLANT
PROBE ANTERIOR 20G W/INFUS NDL (MISCELLANEOUS) IMPLANT
SPEAR EYE SURG WECK-CEL (MISCELLANEOUS) IMPLANT
SUT ETHILON 10 0 CS140 6 (SUTURE) IMPLANT
SUT VICRYL 8 0 TG140 8 (SUTURE) IMPLANT
SYR 3ML LL SCALE MARK (SYRINGE) IMPLANT
TIP SILICONE STR (MISCELLANEOUS)
TIP SILICONE STR 0.3MM UFLOW (MISCELLANEOUS) IMPLANT
TOWEL OR 17X24 6PK STRL BLUE (TOWEL DISPOSABLE) ×4 IMPLANT
WATER STERILE IRR 1000ML POUR (IV SOLUTION) ×2 IMPLANT

## 2011-09-05 NOTE — H&P (Signed)
  78 yo female has had cataract surgery os.  Has developed cloudy opaque posterior capsule.  Admitted for yag laser capsulotomy

## 2011-09-05 NOTE — H&P (Signed)
  78 yo female has had cataract surgery os.  Now has cloudy posterior capsule which causes her  Vision to be blurred.  Admitted for yag laser  Capsulotomy left eye to improve her vision.

## 2011-09-05 NOTE — Brief Op Note (Signed)
09/05/2011  6:56 AM  PATIENT:  [name] [name]  78 y.o. female  PRE-OPERATIVE DIAGNOSIS:  opaque posterior capsule  POST-OPERATIVE DIAGNOSIS: Same  PROCEDURE:  Procedure(s) (LRB): MINOR CAPSULOTOMY (Left)  Total Energy     42 Total Bursts      10 Shots/Bursts     1 Energy/Shot       4.2    SURGEON:  Surgeon(s) and Role:    Vita Erm., MD - Primary  PHYSICIAN ASSISTANT:   ASSISTANTS: none   ANESTHESIA:   none  EBL:     BLOOD ADMINISTERED:none  DRAINS: none   LOCAL MEDICATIONS USED:  NONE  SPECIMEN:  No Specimen  DISPOSITION OF SPECIMEN:  N/A  COUNTS:  YES  TOURNIQUET:  * No tourniquets in log *  DICTATION: .Note written in EPIC  PLAN OF CARE: Discharge to home after PACU  PATIENT DISPOSITION:  Short Stay   Delay start of Pharmacological VTE agent (>24hrs) due to surgical blood loss or risk of bleeding: not applicable

## 2011-09-05 NOTE — Progress Notes (Signed)
SECOND DROP IOPODINE GIVEN TO LEFT EYE PER DR BREWINGTON'S ORDER.  PATIENT TOLERATED PROCEDURE WELL.

## 2011-09-05 NOTE — Discharge Instructions (Signed)
DR Mitzi Davenport SPOKE WITH PATIENT AND FAMILY, INSTRUCTED WHEN TO COME TO OFFICE.

## 2011-09-06 ENCOUNTER — Encounter (HOSPITAL_COMMUNITY): Payer: Self-pay

## 2011-09-06 ENCOUNTER — Encounter (HOSPITAL_COMMUNITY): Payer: Self-pay | Admitting: Ophthalmology

## 2011-09-11 ENCOUNTER — Ambulatory Visit (INDEPENDENT_AMBULATORY_CARE_PROVIDER_SITE_OTHER): Payer: Medicare Other | Admitting: Internal Medicine

## 2011-09-11 ENCOUNTER — Encounter: Payer: Self-pay | Admitting: Internal Medicine

## 2011-09-11 VITALS — BP 120/68 | HR 69 | Ht 60.0 in | Wt 196.8 lb

## 2011-09-11 DIAGNOSIS — J45998 Other asthma: Secondary | ICD-10-CM

## 2011-09-11 DIAGNOSIS — J45909 Unspecified asthma, uncomplicated: Secondary | ICD-10-CM

## 2011-09-11 MED ORDER — ALBUTEROL SULFATE HFA 108 (90 BASE) MCG/ACT IN AERS: 2.0000 | INHALATION_SPRAY | Freq: Four times a day (QID) | RESPIRATORY_TRACT | Status: DC | PRN

## 2011-09-11 NOTE — Patient Instructions (Addendum)
I am glad you are feeling better.   Continue present treatment. Please call as needed.

## 2011-09-11 NOTE — Progress Notes (Signed)
Subjective:    Patient ID: [name] [name], female    DOB: October 08, 1925, 78 y.o.   MRN: [id number]  HPI 09/18/10- 47 yoF never smoker followed for asthma, allergic rhinitis, complicated by DM, GERD, hx right breast Ca Last here January 9. 2012- Note reviewed.  Since last here she feels asthma has been well controlled, with no major flares. Occasional use of rescue inhaler, but no need for nebulizer. Continues daily Advair.  Denies heart problems. Continues tamoxifen, now nearly 5 years out from breast cancer right- lumpectomy no XRT.  03/14/11- 84 yoF never smoker followed for asthma, allergic rhinitis, complicated by DM, GERD, hx right breast Ca Has had flu vaccine. In November she had a mild exacerbation of asthma associated with a viral chest cold. She took a Z-Pak and got better. She would like to be able to  hold a Z-Pak, which we discussed. She needs routine medication refills but otherwise feels that she is at baseline stable and comfortable currently.  09/11/11-  84 yoF never smoker followed for asthma, allergic rhinitis, complicated by DM, GERD, hx right breast Ca Doing much better since getting over PNA back in May.    Daughter is here Treated for an outpatient pneumonia without chest x-ray-given Z-Pak by her primary physician. She now feels back to normal baseline. They have a dehumidifier in the house as this has been a very wet spring. She has had pneumonia vaccine at least twice. CXR 04/17/11-reviewed images with them. We looked at older images to demonstrate that the right hemidiaphragm is chronically elevated. IMPRESSION:  Marked elevation of the right hemidiaphragm again noted. No acute  infiltrate or pulmonary edema. Stable linear atelectasis or  scarring in the right middle lobe.  Original Report Authenticated By: [name], [name]   .Review of Systems- see HPI Constitutional:   No-   weight loss, night sweats, fevers, chills, fatigue, lassitude. HEENT:   No-  headaches, difficulty  swallowing, tooth/dental problems, sore throat,       No-  sneezing, itching, ear ache, nasal congestion, post nasal drip,  CV:  No-   chest pain, orthopnea, PND, swelling in lower extremities, anasarca,  dizziness, palpitations Resp: No-   shortness of breath with exertion or at rest.              No-   productive cough,  No non-productive cough,  No- coughing up of blood.              No-   change in color of mucus.  No- wheezing.   Skin: No-   rash or lesions. GI:  No-   heartburn, indigestion, abdominal pain, nausea, vomiting,  GU: MS:  No-   joint pain or swelling.   Neuro-     nothing unusual Psych:  No- change in mood or affect. No depression or anxiety.  No memory loss.  Objective:  General- Alert, Oriented, Affect-appropriate, Distress- none acute, overweight BP 120/68  Pulse 69  Ht 5' (1.524 m)  Wt 196 lb 12.8 oz (89.268 kg)  BMI 38.43 kg/m2  SpO2 96% Skin- rash-none, lesions- none, excoriation- none Lymphadenopathy- none Head- atraumatic            Eyes- Gross vision intact, PERRLA, conjunctivae clear secretions            Ears- Hearing, canals-normal            Nose- Clear, no-Septal dev, mucus, polyps, erosion, perforation  Throat- Mallampati IV , mucosa clear , drainage- none, tonsils- atrophic Neck- flexible , trachea midline, no stridor , thyroid nl, carotid no bruit Chest - symmetrical excursion , unlabored           Heart/CV- RRR , no murmur , no gallop  , no rub, nl s1 s2                           - JVD- 1-2 cm , edema- none, stasis changes- none, varices- none           Lung- clear to P&A, decreased at right base but limited bilaterally by her body habitus, wheeze- none, cough- none , rub- none.           Chest wall-  Abd-  Br/ Gen/ Rectal- Not done, not indicated Extrem- cyanosis- none, clubbing, none, atrophy- none, strength- nl Neuro- grossly intact to observation

## 2011-09-16 NOTE — Assessment & Plan Note (Signed)
Viruses and weather changes seem to be her most important triggers now. Generally she is well controlled. By her description, she resolved an exacerbation of bronchitis or unsubstantiated pneumonia with help of a Z-Pak from her primary physician. Her chronic right diaphragm paresis is is probably not as limiting as her obesity and lack of regular exercise.

## 2011-10-01 NOTE — Op Note (Signed)
[name] [name], [name] [name]                ACCOUNT NO.:  [id number]  MEDICAL RECORD NO.:  [id number]  LOCATION:  [facility]                         FACILITY:  [facility]  PHYSICIAN:  Salley Scarlet., M.D.DATE OF BIRTH:  12/11/1925  DATE OF PROCEDURE:  09/05/2011 DATE OF DISCHARGE:  09/05/2011                              OPERATIVE REPORT   PREOPERATIVE DIAGNOSIS:  Opaque posterior capsule, left eye.  POSTOPERATIVE DIAGNOSIS:  Opaque posterior capsule, left eye.  OPERATION:  YAG laser capsulotomy, left eye.  PROCEDURE:  The patient was brought to the laser room and positioned appropriately, hands and legs.  This was done after the pupil had been adequately dilated.  Approximately 10 bursts of laser energy were applied to the posterior capsule obtaining an opening in the posterior capsule that was quite satisfactory.  There were 10 bursts of laser energy applied to the posterior capsule to obtain this opening.  Each burst was approximately 4.2 millijoule per burst for a total energy of 42 millijoule.  The patient tolerated the procedure well and was discharged in satisfactory condition with instructions to see me tomorrow morning for further evaluation.  DISCHARGE DIAGNOSIS:  Opaque posterior capsule, left eye.     Salley Scarlet., [name]     TB/MEDQ  D:  10/01/2011  T:  10/01/2011  Job:  [id number]

## 2012-01-08 ENCOUNTER — Other Ambulatory Visit: Payer: Self-pay | Admitting: Internal Medicine

## 2012-03-14 ENCOUNTER — Encounter: Payer: Self-pay | Admitting: Internal Medicine

## 2012-03-14 ENCOUNTER — Ambulatory Visit (INDEPENDENT_AMBULATORY_CARE_PROVIDER_SITE_OTHER)
Admission: RE | Admit: 2012-03-14 | Discharge: 2012-03-14 | Disposition: A | Discharge status: Home / Self Care | Payer: Medicare Other | Source: Ambulatory Visit | Attending: Internal Medicine | Admitting: Internal Medicine

## 2012-03-14 ENCOUNTER — Ambulatory Visit (INDEPENDENT_AMBULATORY_CARE_PROVIDER_SITE_OTHER): Payer: Medicare Other | Admitting: Internal Medicine

## 2012-03-14 VITALS — BP 142/70 | HR 80 | Ht 60.0 in | Wt 200.0 lb

## 2012-03-14 DIAGNOSIS — J45998 Other asthma: Secondary | ICD-10-CM

## 2012-03-14 DIAGNOSIS — J45909 Unspecified asthma, uncomplicated: Secondary | ICD-10-CM

## 2012-03-14 NOTE — Patient Instructions (Signed)
Order- CXR   Dx asthma, hx pneumonia  Please call as needed

## 2012-03-14 NOTE — Progress Notes (Signed)
Subjective:    Patient ID: [name] [name], female    DOB: 1925/04/28, 79 y.o.   MRN: [id number]  HPI 09/18/10- 34 yoF never smoker followed for asthma, allergic rhinitis, complicated by DM, GERD, hx right breast Ca Last here January 9. 2012- Note reviewed.  Since last here she feels asthma has been well controlled, with no major flares. Occasional use of rescue inhaler, but no need for nebulizer. Continues daily Advair.  Denies heart problems. Continues tamoxifen, now nearly 5 years out from breast cancer right- lumpectomy no XRT.  03/14/11- 84 yoF never smoker followed for asthma, allergic rhinitis, complicated by DM, GERD, hx right breast Ca Has had flu vaccine. In November she had a mild exacerbation of asthma associated with a viral chest cold. She took a Z-Pak and got better. She would like to be able to  hold a Z-Pak, which we discussed. She needs routine medication refills but otherwise feels that she is at baseline stable and comfortable currently.  09/11/11-  79 yoF never smoker followed for asthma, allergic rhinitis, complicated by DM, GERD, hx right breast Ca Doing much better since getting over PNA back in May.    Daughter is here Treated for an outpatient pneumonia without chest x-ray-given Z-Pak by her primary physician. She now feels back to normal baseline. They have a dehumidifier in the house as this has been a very wet spring. She has had pneumonia vaccine at least twice. CXR 04/17/11-reviewed images with them. We looked at older images to demonstrate that the right hemidiaphragm is chronically elevated. IMPRESSION:  Marked elevation of the right hemidiaphragm again noted. No acute  infiltrate or pulmonary edema. Stable linear atelectasis or  scarring in the right middle lobe.  Original Report Authenticated By: [name], [name]   1/3//14- 79 yoF never smoker followed for asthma, allergic rhinitis, complicated by DM, GERD, hx right breast Ca             daughter here FOLLOWS FOR: PNA  in October; cold weather causes her to have SOB/wheezing. Outpatient pneumonia was diagnosed without chest x-ray in her home town and treated with prednisone and antibiotic. She is now back to baseline. She is satisfied with how that was managed I pointed out that she gets good care at home, but she still feels reassured by coming to see me twice a year. CXR 03/14/11 IMPRESSION:  Marked elevation of the right hemidiaphragm again noted. No acute  infiltrate or pulmonary edema. Stable linear atelectasis or  scarring in the right middle lobe.  Original Report Authenticated By: [name], [name]   .Review of Systems- see HPI Constitutional:   No-   weight loss, night sweats, fevers, chills, fatigue, lassitude. HEENT:   No-  headaches, difficulty swallowing, tooth/dental problems, sore throat,       No-  sneezing, itching, ear ache, nasal congestion, post nasal drip,  CV:  No-   chest pain, orthopnea, PND, swelling in lower extremities, anasarca,  dizziness, palpitations Resp: No-   shortness of breath with exertion or at rest.              No-   productive cough,  No non-productive cough,  No- coughing up of blood.              No-   change in color of mucus.  No- wheezing.   Skin: No-   rash or lesions. GI:  No-   heartburn, indigestion, abdominal pain, nausea, vomiting,  GU: MS:  No-  joint pain or swelling.   Neuro-     nothing unusual Psych:  No- change in mood or affect. No depression or anxiety.  No memory loss.  Objective:  General- Alert, Oriented, Affect-appropriate, Distress- none acute, overweight BP 142/70  Pulse 80  Ht 5' (1.524 m)  Wt 200 lb (90.719 kg)  BMI 39.06 kg/m2  SpO2 96% Skin- rash-none, lesions- none, excoriation- none Lymphadenopathy- none Head- atraumatic            Eyes- Gross vision intact, PERRLA, conjunctivae clear secretions            Ears- Hearing, canals-normal            Nose- Clear, no-Septal dev, mucus, polyps, erosion, perforation              Throat- Mallampati IV , mucosa clear , drainage- none, tonsils- atrophic Neck- flexible , trachea midline, no stridor , thyroid nl, carotid no bruit Chest - symmetrical excursion , unlabored           Heart/CV- RRR , no murmur , no gallop  , no rub, nl s1 s2                           - JVD- 1-2 cm , edema- none, stasis changes- none, varices- none           Lung- clear to P&A, + few crackles at right base, limited bilaterally by her body habitus, wheeze- none, cough- none , rub- none.           Chest wall-  Abd-  Br/ Gen/ Rectal- Not done, not indicated Extrem- cyanosis- none, clubbing, none, atrophy- none, strength- nl Neuro- grossly intact to observation

## 2012-03-20 NOTE — Progress Notes (Signed)
Quick Note:  Spoke with pt and notified of results per Dr. Young. Pt verbalized understanding and denied any questions.  ______ 

## 2012-03-25 NOTE — Assessment & Plan Note (Signed)
She had an acute respiratory infection which responded appropriately to treatment. She is now back to baseline. We discussed medications. She is up-to-date on pneumonia vaccine Plan-chest x-ray

## 2012-04-01 ENCOUNTER — Other Ambulatory Visit: Payer: Self-pay | Admitting: Internal Medicine

## 2012-04-01 MED ORDER — FLUTICASONE-SALMETEROL 250-50 MCG/DOSE IN AEPB: 1.0000 | INHALATION_SPRAY | Freq: Two times a day (BID) | RESPIRATORY_TRACT | Status: DC

## 2012-07-22 ENCOUNTER — Other Ambulatory Visit: Payer: Self-pay | Admitting: Internal Medicine

## 2012-09-08 ENCOUNTER — Ambulatory Visit: Payer: Medicare Other | Admitting: Internal Medicine

## 2012-09-17 ENCOUNTER — Ambulatory Visit (INDEPENDENT_AMBULATORY_CARE_PROVIDER_SITE_OTHER): Payer: Medicare Other | Admitting: Internal Medicine

## 2012-09-17 ENCOUNTER — Encounter: Payer: Self-pay | Admitting: Internal Medicine

## 2012-09-17 VITALS — BP 108/60 | HR 80 | Ht 61.0 in | Wt 196.8 lb

## 2012-09-17 DIAGNOSIS — J45909 Unspecified asthma, uncomplicated: Secondary | ICD-10-CM

## 2012-09-17 DIAGNOSIS — J3089 Other allergic rhinitis: Secondary | ICD-10-CM

## 2012-09-17 DIAGNOSIS — J45998 Other asthma: Secondary | ICD-10-CM

## 2012-09-17 DIAGNOSIS — J309 Allergic rhinitis, unspecified: Secondary | ICD-10-CM

## 2012-09-17 NOTE — Patient Instructions (Addendum)
Ask your primary physician about your BP medicine when you feel light headed  We can continue the breathing medicines- please call as needed

## 2012-09-17 NOTE — Assessment & Plan Note (Signed)
Good control. Compared air quality with pollen-related seasonal symptoms.

## 2012-09-17 NOTE — Progress Notes (Signed)
Subjective:    Patient ID: [name] [name], female    DOB: March 29, 1925, 79 y.o.   MRN: [id number]  HPI 09/18/10- 65 yoF never smoker followed for asthma, allergic rhinitis, complicated by DM, GERD, hx right breast Ca Last here January 9. 2012- Note reviewed.  Since last here she feels asthma has been well controlled, with no major flares. Occasional use of rescue inhaler, but no need for nebulizer. Continues daily Advair.  Denies heart problems. Continues tamoxifen, now nearly 5 years out from breast cancer right- lumpectomy no XRT.  03/14/11- 84 yoF never smoker followed for asthma, allergic rhinitis, complicated by DM, GERD, hx right breast Ca Has had flu vaccine. In November she had a mild exacerbation of asthma associated with a viral chest cold. She took a Z-Pak and got better. She would like to be able to  hold a Z-Pak, which we discussed. She needs routine medication refills but otherwise feels that she is at baseline stable and comfortable currently.  09/11/11-  84 yoF never smoker followed for asthma, allergic rhinitis, complicated by DM, GERD, hx right breast Ca Doing much better since getting over PNA back in May.    Daughter is here Treated for an outpatient pneumonia without chest x-ray-given Z-Pak by her primary physician. She now feels back to normal baseline. They have a dehumidifier in the house as this has been a very wet spring. She has had pneumonia vaccine at least twice. CXR 04/17/11-reviewed images with them. We looked at older images to demonstrate that the right hemidiaphragm is chronically elevated. IMPRESSION:  Marked elevation of the right hemidiaphragm again noted. No acute  infiltrate or pulmonary edema. Stable linear atelectasis or  scarring in the right middle lobe.  Original Report Authenticated By: [name] [name], [name]   1/3//14- 63 yoF never smoker followed for asthma, allergic rhinitis, complicated by DM, GERD, hx right breast Ca             daughter here FOLLOWS FOR: PNA  in October; cold weather causes her to have SOB/wheezing. Outpatient pneumonia was diagnosed without chest x-ray in her home town and treated with prednisone and antibiotic. She is now back to baseline. She is satisfied with how that was managed I pointed out that she gets good care at home, but she still feels reassured by coming to see me twice a year. CXR 03/14/11 IMPRESSION:  Marked elevation of the right hemidiaphragm again noted. No acute  infiltrate or pulmonary edema. Stable linear atelectasis or  scarring in the right middle lobe.  Original Report Authenticated By: [name] [name], [name]    09/17/12- 83 yoF never smoker followed for asthma, allergic rhinitis, complicated by DM, GERD, hx right breast Ca             daughter here. FOLLOWS FOR: pt reports since discharge June 26 for UTI, diff breathing, dizziness she has improved slightly, still dizzy,increase use of nebulizer and weakness concerned this is related to sinus? Light headed since ER for UTI, w/ pending PCP f/u.  No recent respiratory problem. Meds ok. CXR 03/20/12 IMPRESSION:  Stable exam. No acute findings  Original Report Authenticated By: [name] [name], [name]  ROS: Constitutional:   No-   weight loss, night sweats, fevers, chills, fatigue, lassitude. HEENT:   No-  headaches, difficulty swallowing, tooth/dental problems, sore throat,       No-  sneezing, itching, ear ache, nasal congestion, post nasal drip,  CV:  No-   chest pain, orthopnea, PND, swelling in lower extremities,  anasarca,  +dizziness, palpitations Resp: No-   shortness of breath with exertion or at rest.              No-   productive cough,  No non-productive cough,  No- coughing up of blood.              No-   change in color of mucus.  No- wheezing.   Skin: No-   rash or lesions. GI:  No-   heartburn, indigestion, abdominal pain, nausea, vomiting,  GU: MS:  No-   joint pain or swelling.   Neuro-     nothing unusual Psych:  No- change in mood or affect. No  depression or anxiety.  No memory loss.  Objective:  General- Alert, Oriented, Affect-appropriate, Distress- none acute, overweight BP 108/60  Pulse 80  Ht 5\' 1"  (1.549 m)  Wt 196 lb 12.8 oz (89.268 kg)  BMI 37.2 kg/m2  SpO2 96% Skin- rash-none, lesions- none, excoriation- none Lymphadenopathy- none Head- atraumatic            Eyes- Gross vision intact, PERRLA, conjunctivae clear secretions            Ears- Hearing, canals-normal            [name]- Clear, no-Septal dev, mucus, polyps, erosion, perforation             Throat- Mallampati IV , mucosa clear , drainage- none, tonsils- atrophic Neck- flexible , trachea midline, no stridor , thyroid nl, carotid no bruit Chest - symmetrical excursion , unlabored           Heart/CV- RRR , no murmur , no gallop  , no rub, nl s1 s2                           - JVD- 1-2 cm , edema- none, stasis changes- none, varices- none           Lung- clear to P&A, limited bilaterally by her body habitus, wheeze- none, cough- none , rub- none.           Chest wall-  Abd-  Br/ Gen/ Rectal- Not done, not indicated Extrem- cyanosis- none, clubbing, none, atrophy- none, strength- nl Neuro- grossly intact to observation

## 2012-09-17 NOTE — Assessment & Plan Note (Signed)
Good control with no concerns. Meds discussed.  She may be feeling a little orthostatic on her BP med and is directed to discuss this w/ her PCP.

## 2012-10-16 ENCOUNTER — Ambulatory Visit: Payer: Medicare Other | Admitting: Internal Medicine

## 2013-02-03 ENCOUNTER — Other Ambulatory Visit: Payer: Self-pay | Admitting: Internal Medicine

## 2013-03-09 ENCOUNTER — Other Ambulatory Visit: Payer: Self-pay | Admitting: Internal Medicine

## 2013-03-17 ENCOUNTER — Telehealth: Payer: Self-pay | Admitting: Internal Medicine

## 2013-03-17 NOTE — Telephone Encounter (Signed)
lmomtcb x1 for pt 

## 2013-03-17 NOTE — Telephone Encounter (Signed)
Spoke with daughter. She reports she did not want to bring pt out in the cold weather and the flu going around. Pt is 80 y/o and didn't want to risk it. She r/s pt appt. Nothing further needed

## 2013-03-17 NOTE — Telephone Encounter (Signed)
Pt's daughter returning call can be reached at [phone].Raylene EvertsJuanita S Davis

## 2013-03-20 ENCOUNTER — Ambulatory Visit: Payer: Medicare Other | Admitting: Internal Medicine

## 2013-06-15 ENCOUNTER — Encounter: Payer: Self-pay | Admitting: Internal Medicine

## 2013-06-15 ENCOUNTER — Ambulatory Visit (INDEPENDENT_AMBULATORY_CARE_PROVIDER_SITE_OTHER): Payer: Medicare Other | Admitting: Internal Medicine

## 2013-06-15 VITALS — BP 120/76 | HR 70 | Ht 61.0 in | Wt 198.6 lb

## 2013-06-15 DIAGNOSIS — J302 Other seasonal allergic rhinitis: Secondary | ICD-10-CM

## 2013-06-15 DIAGNOSIS — J45998 Other asthma: Secondary | ICD-10-CM

## 2013-06-15 DIAGNOSIS — J45909 Unspecified asthma, uncomplicated: Secondary | ICD-10-CM

## 2013-06-15 DIAGNOSIS — J309 Allergic rhinitis, unspecified: Secondary | ICD-10-CM

## 2013-06-15 DIAGNOSIS — J3089 Other allergic rhinitis: Secondary | ICD-10-CM

## 2013-06-15 NOTE — Progress Notes (Signed)
Subjective:    Patient ID: [name] [name], female    DOB: 06/07/1925, 80 y.o.   MRN: [id number]  HPI 09/18/10- [age] yoF never smoker followed for asthma, allergic rhinitis, complicated by DM, GERD, hx right breast Ca Last here January 9. 2012- Note reviewed.  Since last here she feels asthma has been well controlled, with no major flares. Occasional use of rescue inhaler, but no need for nebulizer. Continues daily Advair.  Denies heart problems. Continues tamoxifen, now nearly 5 years out from breast cancer right- lumpectomy no XRT.  03/14/11- 80 yoF never smoker followed for asthma, allergic rhinitis, complicated by DM, GERD, hx right breast Ca Has had flu vaccine. In November she had a mild exacerbation of asthma associated with a viral chest cold. She took a Z-Pak and got better. She would like to be able to  hold a Z-Pak, which we discussed. She needs routine medication refills but otherwise feels that she is at baseline stable and comfortable currently.  09/11/11-  80 yoF never smoker followed for asthma, allergic rhinitis, complicated by DM, GERD, hx right breast Ca Doing much better since getting over PNA back in May.    Daughter is here Treated for an outpatient pneumonia without chest x-ray-given Z-Pak by her primary physician. She now feels back to normal baseline. They have a dehumidifier in the house as this has been a very wet spring. She has had pneumonia vaccine at least twice. CXR 04/17/11-reviewed images with them. We looked at older images to demonstrate that the right hemidiaphragm is chronically elevated. IMPRESSION:  Marked elevation of the right hemidiaphragm again noted. No acute  infiltrate or pulmonary edema. Stable linear atelectasis or  scarring in the right middle lobe.  Original Report Authenticated By: Natasha MeadLIVIU POP, [name]   1/3//14- [age] yoF never smoker followed for asthma, allergic rhinitis, complicated by DM, GERD, hx right breast Ca             daughter here FOLLOWS FOR: PNA  in October; cold weather causes her to have SOB/wheezing. Outpatient pneumonia was diagnosed without chest x-ray in her home town and treated with prednisone and antibiotic. She is now back to baseline. She is satisfied with how that was managed I pointed out that she gets good care at home, but she still feels reassured by coming to see me twice a year. CXR 03/14/11 IMPRESSION:  Marked elevation of the right hemidiaphragm again noted. No acute  infiltrate or pulmonary edema. Stable linear atelectasis or  scarring in the right middle lobe.  Original Report Authenticated By: Natasha MeadLIVIU POP, [name]    09/17/12- [age] yoF never smoker followed for asthma, allergic rhinitis, complicated by DM, GERD, hx right breast Ca             daughter here. FOLLOWS FOR: pt reports since discharge June 26 for UTI, diff breathing, dizziness she has improved slightly, still dizzy,increase use of nebulizer and weakness concerned this is related to sinus? Light headed since ER for UTI, w/ pending PCP f/u.  No recent respiratory problem. Meds ok. CXR 03/20/12 IMPRESSION:  Stable exam. No acute findings  Original Report Authenticated By: Charlett NoseKevin Dover, [name]  06/15/13- [age] yoF never smoker followed for asthma, allergic rhinitis, complicated by DM, GERD, hx right breast Ca           FOLLOWS FOR: Slight full feelings in chest last week-season changes. Otherwise was well during the winter time. Had a cold during the winter but that resolved with no lingering wheeze  or cough. Continues using Advair and rescue inhaler as instructed   ROS: Constitutional:   No-   weight loss, night sweats, fevers, chills, fatigue, lassitude. HEENT:   No-  headaches, difficulty swallowing, tooth/dental problems, sore throat,       No-  sneezing, itching, ear ache, nasal congestion, post nasal drip,  CV:  No-   chest pain, orthopnea, PND, swelling in lower extremities, anasarca,  +dizziness, palpitations Resp: No-   shortness of breath with exertion or at  rest.              No-   productive cough,  No non-productive cough,  No- coughing up of blood.              No-   change in color of mucus.  No- wheezing.   Skin: No-   rash or lesions. GI:  No-   heartburn, indigestion, abdominal pain, nausea, vomiting,  GU: MS:  No-   joint pain or swelling.   Neuro-     nothing unusual Psych:  No- change in mood or affect. No depression or anxiety.  No memory loss.  Objective:  General- Alert, Oriented, Affect-appropriate, Distress- none acute, overweight Skin- rash-none, lesions- none, excoriation- none Lymphadenopathy- none Head- atraumatic            Eyes- Gross vision intact, PERRLA, conjunctivae clear secretions            Ears- Hearing, canals-normal            Nose- Clear, no-Septal dev, mucus, polyps, erosion, perforation             Throat- Mallampati IV , mucosa clear , drainage- none, tonsils- atrophic Neck- flexible , trachea midline, no stridor , thyroid nl, carotid no bruit Chest - symmetrical excursion , unlabored           Heart/CV- RRR , no murmur , no gallop  , no rub, nl s1 s2                           - JVD- None , edema- none, stasis changes- none, varices- none           Lung- clear to P&A, limited bilaterally by her body habitus, wheeze- none, cough- none , rub- none.           Chest wall-  Abd-  Br/ Gen/ Rectal- Not done, not indicated Extrem- cyanosis- none, clubbing, none, atrophy- none, strength- nl Neuro- grossly intact to observation

## 2013-06-15 NOTE — Patient Instructions (Signed)
We can continue present meds and hope the rest of the spring goes well.  Please call if we can help.

## 2013-07-12 NOTE — Assessment & Plan Note (Signed)
Seasonal rhinitis has not flared so far this spring

## 2013-07-12 NOTE — Assessment & Plan Note (Signed)
Mild intermittent asthma, now controlled

## 2013-09-03 ENCOUNTER — Other Ambulatory Visit: Payer: Self-pay | Admitting: Internal Medicine

## 2013-12-15 ENCOUNTER — Encounter: Payer: Self-pay | Admitting: Internal Medicine

## 2013-12-15 ENCOUNTER — Ambulatory Visit (INDEPENDENT_AMBULATORY_CARE_PROVIDER_SITE_OTHER): Payer: Medicare Other | Admitting: Internal Medicine

## 2013-12-15 VITALS — BP 122/78 | HR 68 | Ht 61.0 in | Wt 200.6 lb

## 2013-12-15 DIAGNOSIS — J309 Allergic rhinitis, unspecified: Secondary | ICD-10-CM

## 2013-12-15 DIAGNOSIS — J302 Other seasonal allergic rhinitis: Secondary | ICD-10-CM

## 2013-12-15 DIAGNOSIS — J45998 Other asthma: Secondary | ICD-10-CM

## 2013-12-15 DIAGNOSIS — Z23 Encounter for immunization: Secondary | ICD-10-CM

## 2013-12-15 DIAGNOSIS — J3089 Other allergic rhinitis: Secondary | ICD-10-CM

## 2013-12-15 NOTE — Progress Notes (Signed)
Subjective:    Patient ID: [name] RumpfDoris [name], female    DOB: 05/21/1925, 80 y.o.   MRN: [id number]  HPI 09/18/10- 89 yoF never smoker followed for asthma, allergic rhinitis, complicated by DM, GERD, hx right breast Ca Last here January 9. 2012- Note reviewed.  Since last here she feels asthma has been well controlled, with no major flares. Occasional use of rescue inhaler, but no need for nebulizer. Continues daily Advair.  Denies heart problems. Continues tamoxifen, now nearly 5 years out from breast cancer right- lumpectomy no XRT.  03/14/11- 84 yoF never smoker followed for asthma, allergic rhinitis, complicated by DM, GERD, hx right breast Ca Has had flu vaccine. In November she had a mild exacerbation of asthma associated with a viral chest cold. She took a Z-Pak and got better. She would like to be able to  hold a Z-Pak, which we discussed. She needs routine medication refills but otherwise feels that she is at baseline stable and comfortable currently.  09/11/11-  84 yoF never smoker followed for asthma, allergic rhinitis, complicated by DM, GERD, hx right breast Ca Doing much better since getting over PNA back in May.    Daughter is here Treated for an outpatient pneumonia without chest x-ray-given Z-Pak by her primary physician. She now feels back to normal baseline. They have a dehumidifier in the house as this has been a very wet spring. She has had pneumonia vaccine at least twice. CXR 04/17/11-reviewed images with them. We looked at older images to demonstrate that the right hemidiaphragm is chronically elevated. IMPRESSION:  Marked elevation of the right hemidiaphragm again noted. No acute  infiltrate or pulmonary edema. Stable linear atelectasis or  scarring in the right middle lobe.  Original Report Authenticated By: Natasha MeadLIVIU POP, [name]   1/3//14- [age] yoF never smoker followed for asthma, allergic rhinitis, complicated by DM, GERD, hx right breast Ca             daughter here FOLLOWS FOR: PNA  in October; cold weather causes her to have SOB/wheezing. Outpatient pneumonia was diagnosed without chest x-ray in her home town and treated with prednisone and antibiotic. She is now back to baseline. She is satisfied with how that was managed I pointed out that she gets good care at home, but she still feels reassured by coming to see me twice a year. CXR 03/14/11 IMPRESSION:  Marked elevation of the right hemidiaphragm again noted. No acute  infiltrate or pulmonary edema. Stable linear atelectasis or  scarring in the right middle lobe.  Original Report Authenticated By: Natasha MeadLIVIU POP, [name]    09/17/12- [age] yoF never smoker followed for asthma, allergic rhinitis, complicated by DM, GERD, hx right breast Ca             daughter here. FOLLOWS FOR: pt reports since discharge June 26 for UTI, diff breathing, dizziness she has improved slightly, still dizzy,increase use of nebulizer and weakness concerned this is related to sinus? Light headed since ER for UTI, w/ pending PCP f/u.  No recent respiratory problem. Meds ok. CXR 03/20/12 IMPRESSION:  Stable exam. No acute findings  Original Report Authenticated By: Charlett NoseKevin Dover, [name]  06/15/13- [age] yoF never smoker followed for asthma, allergic rhinitis, complicated by DM, GERD, hx right breast Ca           FOLLOWS FOR: Slight full feelings in chest last week-season changes. Otherwise was well during the winter time. Had a cold during the winter but that resolved with no lingering wheeze  or cough. Continues using Advair and rescue inhaler as instructed  12/15/13- 87 yoF never smoker followed for asthma, allergic rhinitis, complicated by DM, GERD, hx right breast Ca  FOLLOWS FOR: breathing has been doing well; stayed inside for most of the rain back home. Uses pro-air rescue inhaler, rarely needs her nebulizer. Usual trigger is whether change. Had flu shot. Asks about pneumonia vaccine.  ROS: Constitutional:   No-   weight loss, night sweats, fevers, chills,  fatigue, lassitude. HEENT:   No-  headaches, difficulty swallowing, tooth/dental problems, sore throat,       No-  sneezing, itching, ear ache, nasal congestion, post nasal drip,  CV:  No-   chest pain, orthopnea, PND, swelling in lower extremities, anasarca,  +dizziness, palpitations Resp: No-   shortness of breath with exertion or at rest.              No-   productive cough,  No non-productive cough,  No- coughing up of blood.              No-   change in color of mucus.  No- wheezing.   Skin: No-   rash or lesions. GI:  No-   heartburn, indigestion, abdominal pain, nausea, vomiting,  GU: MS:  No-   joint pain or swelling.   Neuro-     nothing unusual Psych:  No- change in mood or affect. No depression or anxiety.  No memory loss.  Objective:  General- Alert, Oriented, Affect-appropriate, Distress- none acute, overweight Skin- rash-none, lesions- none, excoriation- none Lymphadenopathy- none Head- atraumatic            Eyes- Gross vision intact, PERRLA, conjunctivae clear secretions            Ears- Hearing, canals-normal            Nose- Clear, no-Septal dev, mucus, polyps, erosion, perforation             Throat- Mallampati IV , mucosa clear , drainage- none, tonsils- atrophic Neck- flexible , trachea midline, no stridor , thyroid nl, carotid no bruit Chest - symmetrical excursion , unlabored           Heart/CV- RRR , no murmur , no gallop  , no rub, nl s1 s2                           - JVD- None , edema- none, stasis changes- none, varices- none           Lung- clear to P&A, limited bilaterally by her body habitus, wheeze- none, cough- none , rub- none.           Chest wall-  Abd-  Br/ Gen/ Rectal- Not done, not indicated Extrem- cyanosis- none, clubbing, none, atrophy- none, strength- nl Neuro- grossly intact to observation

## 2013-12-15 NOTE — Patient Instructions (Signed)
Prevnar-13 pneumococcal vaccine  Please call as needed for medicine refills or questions

## 2013-12-20 NOTE — Assessment & Plan Note (Signed)
Adequate control usually with rescue inhaler as discussed Plan-Prevnar 13

## 2013-12-20 NOTE — Assessment & Plan Note (Signed)
Currently controlled.

## 2014-01-28 ENCOUNTER — Other Ambulatory Visit: Payer: Self-pay | Admitting: Internal Medicine

## 2014-06-17 ENCOUNTER — Ambulatory Visit (INDEPENDENT_AMBULATORY_CARE_PROVIDER_SITE_OTHER): Payer: Medicare Other | Admitting: Internal Medicine

## 2014-06-17 ENCOUNTER — Encounter: Payer: Self-pay | Admitting: Internal Medicine

## 2014-06-17 ENCOUNTER — Other Ambulatory Visit (INDEPENDENT_AMBULATORY_CARE_PROVIDER_SITE_OTHER): Payer: Medicare Other

## 2014-06-17 ENCOUNTER — Ambulatory Visit (INDEPENDENT_AMBULATORY_CARE_PROVIDER_SITE_OTHER)
Admission: RE | Admit: 2014-06-17 | Discharge: 2014-06-17 | Disposition: A | Discharge status: Home / Self Care | Payer: Medicare Other | Source: Ambulatory Visit | Attending: Internal Medicine | Admitting: Internal Medicine

## 2014-06-17 ENCOUNTER — Encounter (INDEPENDENT_AMBULATORY_CARE_PROVIDER_SITE_OTHER): Payer: Self-pay

## 2014-06-17 VITALS — BP 118/62 | HR 64 | Ht 61.0 in | Wt 200.0 lb

## 2014-06-17 DIAGNOSIS — J45998 Other asthma: Secondary | ICD-10-CM

## 2014-06-17 DIAGNOSIS — R0609 Other forms of dyspnea: Secondary | ICD-10-CM

## 2014-06-17 DIAGNOSIS — J45901 Unspecified asthma with (acute) exacerbation: Secondary | ICD-10-CM

## 2014-06-17 LAB — CBC WITH DIFFERENTIAL/PLATELET
BASOS ABS: 0 10*3/uL (ref 0.0–0.1)
Basophils Relative: 0.5 % (ref 0.0–3.0)
EOS ABS: 0.5 10*3/uL (ref 0.0–0.7)
Eosinophils Relative: 5.3 % — ABNORMAL HIGH (ref 0.0–5.0)
HCT: 39.3 % (ref 36.0–46.0)
Hemoglobin: 13.2 g/dL (ref 12.0–15.0)
Lymphocytes Relative: 25 % (ref 12.0–46.0)
Lymphs Abs: 2.2 10*3/uL (ref 0.7–4.0)
MCHC: 33.7 g/dL (ref 30.0–36.0)
MCV: 88.4 fl (ref 78.0–100.0)
Monocytes Absolute: 0.7 10*3/uL (ref 0.1–1.0)
Monocytes Relative: 7.7 % (ref 3.0–12.0)
NEUTROS PCT: 61.5 % (ref 43.0–77.0)
Neutro Abs: 5.5 10*3/uL (ref 1.4–7.7)
Platelets: 307 10*3/uL (ref 150.0–400.0)
RBC: 4.45 Mil/uL (ref 3.87–5.11)
RDW: 14.6 % (ref 11.5–15.5)
WBC: 8.9 10*3/uL (ref 4.0–10.5)

## 2014-06-17 LAB — BASIC METABOLIC PANEL
BUN: 20 mg/dL (ref 6–23)
CO2: 32 mEq/L (ref 19–32)
Calcium: 9.5 mg/dL (ref 8.4–10.5)
Chloride: 102 mEq/L (ref 96–112)
Creatinine, Ser: 0.89 mg/dL (ref 0.40–1.20)
GFR: 63.85 mL/min (ref >60.00)
Glucose, Bld: 85 mg/dL (ref 70–99)
Potassium: 4.4 mEq/L (ref 3.5–5.1)
Sodium: 137 mEq/L (ref 135–145)

## 2014-06-17 LAB — BRAIN NATRIURETIC PEPTIDE: PRO B NATRI PEPTIDE: 40 pg/mL (ref 0.0–100.0)

## 2014-06-17 MED ORDER — UMECLIDINIUM-VILANTEROL 62.5-25 MCG/INH IN AEPB: 1.0000 | INHALATION_SPRAY | Freq: Every day | RESPIRATORY_TRACT | Status: DC

## 2014-06-17 NOTE — Progress Notes (Signed)
Subjective:    Patient ID: [name] [name], female    DOB: 05/21/1925, 81 y.o.   MRN: [id number]  HPI 09/18/10- 89 yoF never smoker followed for asthma, allergic rhinitis, complicated by DM, GERD, hx right breast Ca Last here January 9. 2012- Note reviewed.  Since last here she feels asthma has been well controlled, with no major flares. Occasional use of rescue inhaler, but no need for nebulizer. Continues daily Advair.  Denies heart problems. Continues tamoxifen, now nearly 5 years out from breast cancer right- lumpectomy no XRT.  03/14/11- 84 yoF never smoker followed for asthma, allergic rhinitis, complicated by DM, GERD, hx right breast Ca Has had flu vaccine. In November she had a mild exacerbation of asthma associated with a viral chest cold. She took a Z-Pak and got better. She would like to be able to  hold a Z-Pak, which we discussed. She needs routine medication refills but otherwise feels that she is at baseline stable and comfortable currently.  09/11/11-  84 yoF never smoker followed for asthma, allergic rhinitis, complicated by DM, GERD, hx right breast Ca Doing much better since getting over PNA back in May.    Daughter is here Treated for an outpatient pneumonia without chest x-ray-given Z-Pak by her primary physician. She now feels back to normal baseline. They have a dehumidifier in the house as this has been a very wet spring. She has had pneumonia vaccine at least twice. CXR 04/17/11-reviewed images with them. We looked at older images to demonstrate that the right hemidiaphragm is chronically elevated. IMPRESSION:  Marked elevation of the right hemidiaphragm again noted. No acute  infiltrate or pulmonary edema. Stable linear atelectasis or  scarring in the right middle lobe.  Original Report Authenticated By: [name] [name], [name]   1/3//14- [age] yoF never smoker followed for asthma, allergic rhinitis, complicated by DM, GERD, hx right breast Ca             daughter here FOLLOWS FOR: PNA  in October; cold weather causes her to have SOB/wheezing. Outpatient pneumonia was diagnosed without chest x-ray in her home town and treated with prednisone and antibiotic. She is now back to baseline. She is satisfied with how that was managed I pointed out that she gets good care at home, but she still feels reassured by coming to see me twice a year. CXR 03/14/11 IMPRESSION:  Marked elevation of the right hemidiaphragm again noted. No acute  infiltrate or pulmonary edema. Stable linear atelectasis or  scarring in the right middle lobe.  Original Report Authenticated By: [name] [name], [name]    09/17/12- [age] yoF never smoker followed for asthma, allergic rhinitis, complicated by DM, GERD, hx right breast Ca             daughter here. FOLLOWS FOR: pt reports since discharge June 26 for UTI, diff breathing, dizziness she has improved slightly, still dizzy,increase use of nebulizer and weakness concerned this is related to sinus? Light headed since ER for UTI, w/ pending PCP f/u.  No recent respiratory problem. Meds ok. CXR 03/20/12 IMPRESSION:  Stable exam. No acute findings  Original Report Authenticated By: [name] [name], [name]  06/15/13- [age] yoF never smoker followed for asthma, allergic rhinitis, complicated by DM, GERD, hx right breast Ca           FOLLOWS FOR: Slight full feelings in chest last week-season changes. Otherwise was well during the winter time. Had a cold during the winter but that resolved with no lingering wheeze  or cough. Continues using Advair and rescue inhaler as instructed  12/15/13- 87 yoF never smoker followed for asthma, allergic rhinitis, complicated by DM, GERD, hx right breast Ca  FOLLOWS FOR: breathing has been doing well; stayed inside for most of the rain back home. Uses pro-air rescue inhaler, rarely needs her nebulizer. Usual trigger is whether change. Had flu shot. Asks about pneumonia vaccine.  06/17/14- 87 yoF never smoker followed for asthma, allergic rhinitis,  complicated by DM, GERD, hx right breast Ca   Daughter here Follows [facility]:WJXBJ neb machine more; chest tightness; pulling feeling in chest; SOB w/activity; ED visit 06/07/14  ROS: Constitutional:   No-   weight loss, night sweats, fevers, chills, fatigue, lassitude. HEENT:   No-  headaches, difficulty swallowing, tooth/dental problems, sore throat,       No-  sneezing, itching, ear ache, nasal congestion, post nasal drip,  CV:  No-   chest pain, orthopnea, PND, swelling in lower extremities, anasarca,  +dizziness, palpitations Resp: + shortness of breath with exertion or at rest.              No-   productive cough,  No non-productive cough,  No- coughing up of blood.              No-   change in color of mucus.  No- wheezing.   Skin: No-   rash or lesions. GI:  No-   heartburn, indigestion, abdominal pain, nausea, vomiting,  GU: MS:  No-   joint pain or swelling.   Neuro-     nothing unusual Psych:  No- change in mood or affect. No depression or anxiety.  No memory loss.  Objective:  General- Alert, Oriented, Affect-appropriate, Distress- none acute, overweight Skin- rash-none, lesions- none, excoriation- none Lymphadenopathy- none Head- atraumatic            Eyes- Gross vision intact, PERRLA, conjunctivae clear secretions            Ears- Hearing, canals-normal            Nose- Clear, no-Septal dev, mucus, polyps, erosion, perforation             Throat- Mallampati IV , mucosa clear , drainage- none, tonsils- atrophic Neck- flexible , trachea midline, no stridor , thyroid nl, carotid no bruit Chest - symmetrical excursion , unlabored           Heart/CV- RRR , no murmur , no gallop  , no rub, nl s1 s2                           - JVD- None , edema- none, stasis changes- none, varices- none           Lung- clear to P&A, limited bilaterally by her body habitus, wheeze- none, cough- none , rub- none.           Chest wall-  Abd-  Br/ Gen/ Rectal- Not done, not indicated Extrem- cyanosis-  none, clubbing, none, atrophy- none, strength- nl Neuro- grossly intact to observation

## 2014-06-17 NOTE — Patient Instructions (Signed)
Order- walk test room air oximetry   Dx asthma exacerbation, dyspnea on exertion              CXR              Office spirometry               Lab- CBC w diff, BNP, BMET, D-dimer  Sample Anoro inhaler-  1 puff, once daily. Try this instead of Advair. When the sample is used up, go back to Advair.

## 2014-06-18 LAB — D-DIMER, QUANTITATIVE: D-Dimer, Quant: 0.45 ug/mL-FEU (ref 0.00–0.48)

## 2014-06-20 DIAGNOSIS — R0609 Other forms of dyspnea: Secondary | ICD-10-CM | POA: Insufficient documentation

## 2014-06-20 NOTE — Assessment & Plan Note (Signed)
Recent exacerbation consistent with either viral or allergic trigger, now improved but still with more dyspnea on exertion than she is used to. We need to update our understanding of her lung function. Plan- walk oximetry, CXR

## 2014-06-20 NOTE — Assessment & Plan Note (Signed)
This may not all be pulmonary at her age Plan- CXR, oximetry, labs

## 2014-06-21 ENCOUNTER — Telehealth: Payer: Self-pay | Admitting: Internal Medicine

## 2014-06-21 NOTE — Telephone Encounter (Signed)
Patients daughter notified of results. Nothing further needed.

## 2014-06-28 ENCOUNTER — Telehealth: Payer: Self-pay | Admitting: Internal Medicine

## 2014-06-28 NOTE — Telephone Encounter (Signed)
Spoke with pt's daughter, [name] [name]. States that while using her nebulizer machine yesterday, the pt became short of breath. She had pain underneath her breasts. This is the first time this has happened to her. They would like CY's recommendations.  No Known Allergies  CY - please advise. Thanks.

## 2014-06-28 NOTE — Telephone Encounter (Signed)
I can't tell what this was. First thought would be swallowed air. How long did pain last, did any other discomfort go along with it, did the pain radiate into neck, back or arm, was there sweating or nausea with the pain? Suggest she see her PCP about it.

## 2014-06-28 NOTE — Telephone Encounter (Signed)
Pt's daughter is aware of response. Nothing further was needed.

## 2014-09-30 ENCOUNTER — Ambulatory Visit: Payer: Medicare Other | Admitting: Internal Medicine

## 2015-01-20 ENCOUNTER — Encounter: Payer: Self-pay | Admitting: Internal Medicine

## 2015-01-20 ENCOUNTER — Ambulatory Visit (INDEPENDENT_AMBULATORY_CARE_PROVIDER_SITE_OTHER): Payer: Medicare Other | Admitting: Internal Medicine

## 2015-01-20 VITALS — BP 128/70 | HR 76 | Ht 61.0 in | Wt 198.0 lb

## 2015-01-20 DIAGNOSIS — J3089 Other allergic rhinitis: Secondary | ICD-10-CM

## 2015-01-20 DIAGNOSIS — J45998 Other asthma: Secondary | ICD-10-CM

## 2015-01-20 DIAGNOSIS — J302 Other seasonal allergic rhinitis: Secondary | ICD-10-CM

## 2015-01-20 DIAGNOSIS — J309 Allergic rhinitis, unspecified: Secondary | ICD-10-CM

## 2015-01-20 MED ORDER — UMECLIDINIUM-VILANTEROL 62.5-25 MCG/INH IN AEPB: 1.0000 | INHALATION_SPRAY | Freq: Every day | RESPIRATORY_TRACT | Status: DC

## 2015-01-20 MED ORDER — ALBUTEROL SULFATE HFA 108 (90 BASE) MCG/ACT IN AERS: INHALATION_SPRAY | RESPIRATORY_TRACT | Status: AC

## 2015-01-20 NOTE — Patient Instructions (Signed)
Reill script sent for Proair  Sample and script sent for Anoro Ellipta inhaler 1 puff, once daily        Instead of Advair
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
## 2015-01-22 NOTE — Assessment & Plan Note (Signed)
Discussed tachycardia palpitation with bronchodilators. She seemed to tolerate Anoro well Plan-refill pro air. Replace Advair with sample and prescription for Anoro Ellipta

## 2015-07-21 ENCOUNTER — Ambulatory Visit (INDEPENDENT_AMBULATORY_CARE_PROVIDER_SITE_OTHER)
Admission: RE | Admit: 2015-07-21 | Discharge: 2015-07-21 | Disposition: A | Discharge status: Home / Self Care | Payer: Medicare Other | Source: Ambulatory Visit | Attending: Internal Medicine | Admitting: Internal Medicine

## 2015-07-21 ENCOUNTER — Ambulatory Visit (INDEPENDENT_AMBULATORY_CARE_PROVIDER_SITE_OTHER): Payer: Medicare Other | Admitting: Internal Medicine

## 2015-07-21 ENCOUNTER — Encounter: Payer: Self-pay | Admitting: Internal Medicine

## 2015-07-21 VITALS — BP 148/68 | HR 74 | Ht 61.0 in | Wt 199.4 lb

## 2015-07-21 DIAGNOSIS — J4530 Mild persistent asthma, uncomplicated: Secondary | ICD-10-CM | POA: Diagnosis not present

## 2015-07-21 NOTE — Patient Instructions (Signed)
Order- CXR     Dx Asthmatic bronchitis mild persistent  Please call as needed
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
## 2016-01-24 ENCOUNTER — Encounter: Payer: Self-pay | Admitting: Internal Medicine

## 2016-01-24 ENCOUNTER — Ambulatory Visit (INDEPENDENT_AMBULATORY_CARE_PROVIDER_SITE_OTHER): Payer: Medicare Other | Admitting: Internal Medicine

## 2016-01-24 DIAGNOSIS — J4520 Mild intermittent asthma, uncomplicated: Secondary | ICD-10-CM

## 2016-01-24 NOTE — Patient Instructions (Signed)
We can continue present meds  Please call as needed 

## 2016-01-24 NOTE — Assessment & Plan Note (Signed)
Recurrent hospitalization the summer but since then has been clear. She describes episode is triggered by irritant odor. She understands appropriate usage of her medicines as reviewed today. Med refills are provided by her primary physician. I again raised option of having her follow with him Brother than making the trip to [place]. She and daughter would like to stay on our schedule here. They visit with family on these trips.
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
## 2017-01-03 ENCOUNTER — Ambulatory Visit: Payer: Medicare Other | Admitting: Internal Medicine

## 2017-06-07 ENCOUNTER — Ambulatory Visit: Payer: Medicare Other | Admitting: Internal Medicine

## 2017-09-03 ENCOUNTER — Ambulatory Visit: Payer: Medicare Other | Admitting: Internal Medicine

## 2017-10-10 IMAGING — DX DG CHEST 2V
2 series · 2 of 2 positions shown · non-contrast
Comparison: PA and lateral chest x-ray June 17, 2014

CLINICAL DATA: History of asthmatic bronchitis with cough ; history
of hypertension, diabetes, and breast and uterine malignancy.
Nonsmoker.

EXAM:
CHEST  2 VIEW

[chest pa]
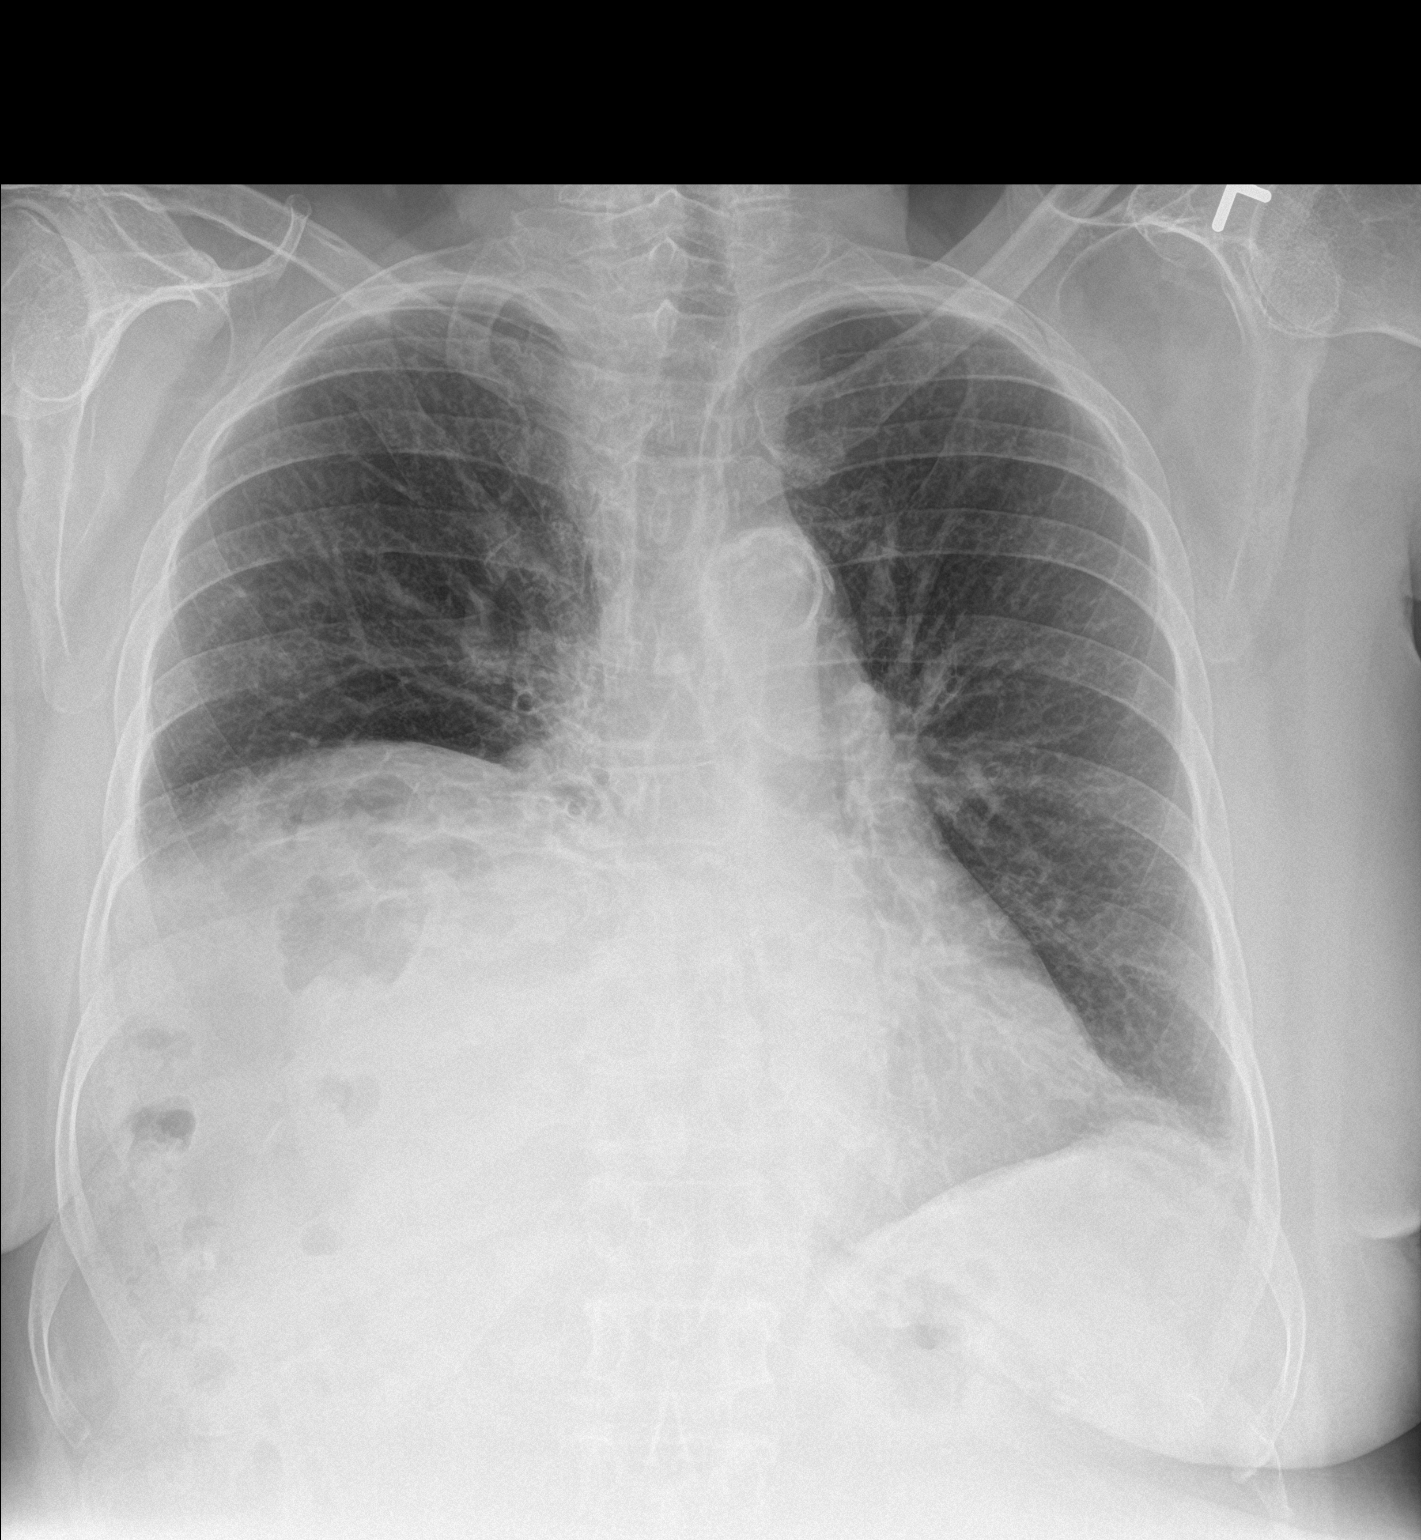

[chest lat]
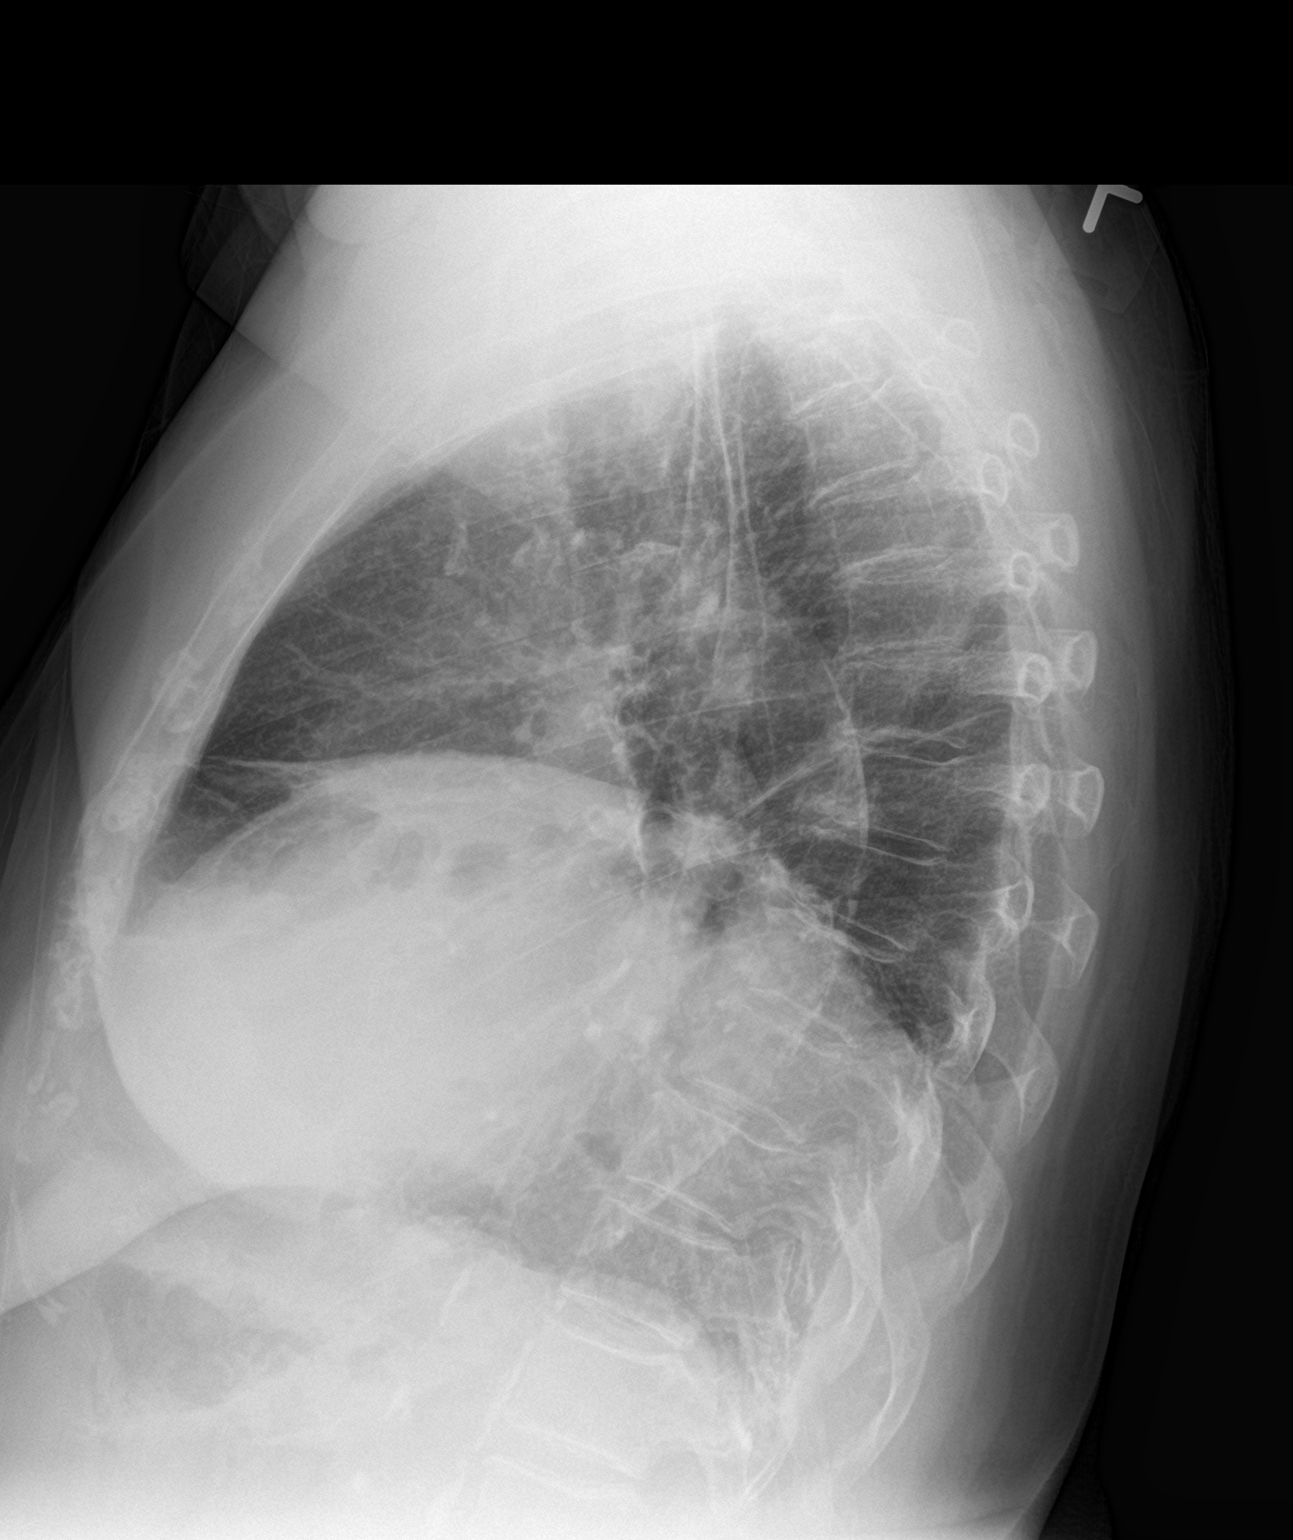

[2 of 2 positions shown; findings below may reference images not displayed]

FINDINGS: There is chronic elevation of the right hemidiaphragm. There is
stable atelectasis or scarring anteriorly at the right lung base.
There is minimal density at the left lung base which likely reflects
scarring as well. There is no alveolar infiltrate. There is no
pleural effusion or pneumothorax. The left heart border appears
normal. The pulmonary vascularity is not clearly engorged. There is
calcification wall of the thoracic aorta. There is multilevel
degenerative disc disease of the thoracic spine.
IMPRESSION: There is no evidence of pneumonia nor CHF. There is chronic
elevation of the right hemidiaphragm. There is stable bibasilar
subsegmental atelectasis or scarring.
